# Patient Record
Sex: Male | Born: 1955 | Race: Black or African American | Hispanic: No | Marital: Married | Smoking: Never smoker
Health system: Southern US, Community
[De-identification: ages and names within clinical notes are randomized; demographics above are authoritative.]

## PROBLEM LIST (undated history)

## (undated) DIAGNOSIS — M109 Gout, unspecified: Secondary | ICD-10-CM

## (undated) DIAGNOSIS — I10 Essential (primary) hypertension: Secondary | ICD-10-CM

## (undated) DIAGNOSIS — D473 Essential (hemorrhagic) thrombocythemia: Secondary | ICD-10-CM

## (undated) DIAGNOSIS — D75839 Thrombocytosis, unspecified: Secondary | ICD-10-CM

## (undated) DIAGNOSIS — T7840XA Allergy, unspecified, initial encounter: Secondary | ICD-10-CM

## (undated) HISTORY — DX: Thrombocytosis, unspecified: D75.839

## (undated) HISTORY — DX: Gout, unspecified: M10.9

## (undated) HISTORY — DX: Essential (hemorrhagic) thrombocythemia: D47.3

## (undated) HISTORY — DX: Allergy, unspecified, initial encounter: T78.40XA

## (undated) HISTORY — DX: Essential (primary) hypertension: I10

---

## 2007-11-23 ENCOUNTER — Ambulatory Visit: Payer: Self-pay

## 2007-11-27 ENCOUNTER — Emergency Department: Payer: Self-pay | Admitting: Emergency Medicine

## 2010-06-11 ENCOUNTER — Ambulatory Visit: Payer: Self-pay | Admitting: Family Medicine

## 2010-09-17 ENCOUNTER — Emergency Department: Payer: Self-pay | Admitting: Emergency Medicine

## 2011-04-11 ENCOUNTER — Ambulatory Visit: Payer: Self-pay | Admitting: Family Medicine

## 2012-09-28 LAB — HM COLONOSCOPY

## 2014-07-18 ENCOUNTER — Ambulatory Visit: Payer: Self-pay

## 2014-07-22 ENCOUNTER — Ambulatory Visit: Payer: Self-pay

## 2014-11-14 ENCOUNTER — Ambulatory Visit: Payer: Self-pay | Admitting: Hematology and Oncology

## 2015-05-23 ENCOUNTER — Other Ambulatory Visit: Payer: Self-pay | Admitting: Family Medicine

## 2015-05-23 NOTE — Telephone Encounter (Signed)
Pt notified that he needs to schedule bp f/u. Per last ov in 01/01/15 patient was to f/u in 2 weeks. Pt told he could get 30 day refill and no further until seen.

## 2015-07-10 ENCOUNTER — Encounter: Payer: Self-pay | Admitting: Family Medicine

## 2015-07-10 ENCOUNTER — Ambulatory Visit (INDEPENDENT_AMBULATORY_CARE_PROVIDER_SITE_OTHER): Payer: 59 | Admitting: Family Medicine

## 2015-07-10 VITALS — BP 170/90 | HR 66 | Temp 98.8°F | Resp 16 | Ht 71.0 in | Wt 219.6 lb

## 2015-07-10 DIAGNOSIS — E119 Type 2 diabetes mellitus without complications: Secondary | ICD-10-CM

## 2015-07-10 DIAGNOSIS — Z23 Encounter for immunization: Secondary | ICD-10-CM | POA: Diagnosis not present

## 2015-07-10 DIAGNOSIS — I10 Essential (primary) hypertension: Secondary | ICD-10-CM | POA: Diagnosis not present

## 2015-07-10 DIAGNOSIS — R7303 Prediabetes: Secondary | ICD-10-CM | POA: Insufficient documentation

## 2015-07-10 LAB — POCT GLYCOSYLATED HEMOGLOBIN (HGB A1C): Hemoglobin A1C: 6.2

## 2015-07-10 MED ORDER — LISINOPRIL 40 MG PO TABS: 40.0000 mg | ORAL_TABLET | Freq: Every day | ORAL | Status: DC

## 2015-07-10 MED ORDER — AMLODIPINE BESYLATE 10 MG PO TABS: ORAL_TABLET | ORAL | Status: DC

## 2015-07-10 MED ORDER — METFORMIN HCL ER (OSM) 500 MG PO TB24: 500.0000 mg | ORAL_TABLET | Freq: Every day | ORAL | Status: DC

## 2015-07-10 NOTE — Progress Notes (Signed)
Name: Darrell Bartolucci Bath[place] Sr.   MRN: [id number]    DOB: 04/04/1956   Date:07/10/2015       Progress Note  Subjective  Chief Complaint  Chief Complaint  Patient presents with  . Hypertension    follow up  . Diabetes    HPI Here for f/u of HBP.  Also with DM.  Has been out of Metformin for 1 week.  Just runni ng out of BP meds.  No c/o  No problem-specific assessment & plan notes found for this encounter.   Past Medical History  Diagnosis Date  . Allergy   . Hypertension   . Gout   . Thrombocytosis ([place])     Social History  Substance Use Topics  . Smoking status: Never Smoker   . Smokeless tobacco: Not on file  . Alcohol Use: Yes     Current outpatient prescriptions:  .  amLODipine (NORVASC) 5 MG tablet, TAKE ONE TABLET BY MOUTH ONCE DAILY, Disp: 30 tablet, Rfl: 0 .  lisinopril (PRINIVIL,ZESTRIL) 40 MG tablet, Take 40 mg by mouth daily., Disp: , Rfl:  .  metformin (FORTAMET) 500 MG (OSM) 24 hr tablet, Take 500 mg by mouth daily with breakfast., Disp: , Rfl:  .  CIALIS 20 MG tablet, , Disp: , Rfl:   No Known Allergies  Review of Systems  Constitutional: Negative for fever, chills, weight loss and malaise/fatigue.  HENT: Positive for congestion. Negative for hearing loss.   Eyes: Negative for blurred vision and double vision.  Respiratory: Negative for cough, sputum production, shortness of breath and wheezing.   Cardiovascular: Negative for chest pain, palpitations and leg swelling.  Gastrointestinal: Negative for heartburn, abdominal pain and blood in stool.  Genitourinary: Negative for dysuria, urgency and frequency.  Musculoskeletal: Negative for myalgias and joint pain.  Skin: Negative for rash.  Neurological: Negative for dizziness, tremors, weakness and headaches.  Psychiatric/Behavioral: Negative for depression.      Objective  Filed Vitals:   07/10/15 0837  BP: 159/97  Pulse: 66  Temp: 98.8 F (37.1 C)  TempSrc: Oral  Resp: 16  Height: 5\' 11"   (1.803 m)  Weight: 219 lb 9.6 oz (99.61 kg)     Physical Exam  Constitutional: He is oriented to person, place, and time and well-developed, well-nourished, and in no distress.  HENT:  Head: Normocephalic and atraumatic.  Eyes: Conjunctivae and EOM are normal. Pupils are equal, round, and reactive to light. No scleral icterus.  Neck: Normal range of motion. Neck supple. Carotid bruit is not present. No thyromegaly present.  Cardiovascular: Normal rate, regular rhythm, normal heart sounds and intact distal pulses.  Exam reveals no gallop and no friction rub.   No murmur heard. Pulmonary/Chest: Effort normal and breath sounds normal. No respiratory distress. He has no wheezes.  Abdominal: Soft. Bowel sounds are normal. He exhibits no distension and no mass. There is no tenderness.  Musculoskeletal: Normal range of motion. He exhibits no edema.  Lymphadenopathy:    He has no cervical adenopathy.  Neurological: He is alert and oriented to person, place, and time.  Vitals reviewed.     No results found for this or any previous visit (from the past 2160 hour(s)).   Assessment & Plan   1. Essential hypertension  - amLODipine (NORVASC) 10 MG tablet; Take 1 tablet daily for BP  Dispense: 90 tablet; Refill: 3 - lisinopril (PRINIVIL,ZESTRIL) 40 MG tablet; Take 1 tablet (40 mg total) by mouth daily.  Dispense: 90 tablet; Refill: 3 -  Comprehensive Metabolic Panel (CMET)  2. Type 2 diabetes mellitus without complication, without long-term current use of insulin (HCC)  - POCT HgB A1C- 6.2 - metformin (FORTAMET) 500 MG (OSM) 24 hr tablet; Take 1 tablet (500 mg total) by mouth daily with breakfast.  Dispense: 90 tablet; Refill: 3 - CBC with Differential - Lipid Profile  3. Need for influenza vaccination  - Flu Vaccine QUAD 36+ mos PF IM (Fluarix & Fluzone Quad PF)

## 2015-07-11 ENCOUNTER — Telehealth: Payer: Self-pay | Admitting: Family Medicine

## 2015-07-11 NOTE — Telephone Encounter (Signed)
Called [facility] and patient medications were not that high. Lisinopril is #33.64 for 90 days and Amlodipine is 49.72 for 90 day. Patient informed.[place]

## 2015-07-11 NOTE — Telephone Encounter (Signed)
Pt said [facility] was faxing something about the BP medicine that was sent over yesterday.  It was going to cost him over $200.  He doesn't have any BP medication now.  Please call [phone]

## 2015-07-31 LAB — CBC WITH DIFFERENTIAL/PLATELET
BASOS ABS: 0 10*3/uL (ref 0.0–0.2)
Basos: 1 %
EOS (ABSOLUTE): 0.1 10*3/uL (ref 0.0–0.4)
EOS: 3 %
HEMOGLOBIN: 14.6 g/dL (ref 12.6–17.7)
Hematocrit: 43.5 % (ref 37.5–51.0)
IMMATURE GRANULOCYTES: 0 %
Immature Grans (Abs): 0 10*3/uL (ref 0.0–0.1)
LYMPHS ABS: 1.9 10*3/uL (ref 0.7–3.1)
LYMPHS: 57 %
MCH: 28.7 pg (ref 26.6–33.0)
MCHC: 33.6 g/dL (ref 31.5–35.7)
MCV: 86 fL (ref 79–97)
MONOCYTES: 8 %
Monocytes Absolute: 0.3 10*3/uL (ref 0.1–0.9)
NEUTROS PCT: 31 %
Neutrophils Absolute: 1 10*3/uL — ABNORMAL LOW (ref 1.4–7.0)
PLATELETS: 420 10*3/uL — AB (ref 150–379)
RBC: 5.08 x10E6/uL (ref 4.14–5.80)
RDW: 12.9 % (ref 12.3–15.4)
WBC: 3.3 10*3/uL — AB (ref 3.4–10.8)

## 2015-07-31 LAB — COMPREHENSIVE METABOLIC PANEL
A/G RATIO: 1.6 (ref 1.1–2.5)
ALBUMIN: 4.4 g/dL (ref 3.5–5.5)
ALK PHOS: 46 IU/L (ref 39–117)
ALT: 19 IU/L (ref 0–44)
AST: 17 IU/L (ref 0–40)
BUN / CREAT RATIO: 16 (ref 9–20)
BUN: 12 mg/dL (ref 6–24)
Bilirubin Total: 0.4 mg/dL (ref 0.0–1.2)
CALCIUM: 9.8 mg/dL (ref 8.7–10.2)
CO2: 22 mmol/L (ref 18–29)
CREATININE: 0.75 mg/dL — AB (ref 0.76–1.27)
Chloride: 101 mmol/L (ref 97–106)
GFR calc Af Amer: 117 mL/min/{1.73_m2} (ref >59)
GFR, EST NON AFRICAN AMERICAN: 101 mL/min/{1.73_m2} (ref >59)
GLOBULIN, TOTAL: 2.7 g/dL (ref 1.5–4.5)
Glucose: 123 mg/dL — ABNORMAL HIGH (ref 65–99)
POTASSIUM: 4.2 mmol/L (ref 3.5–5.2)
SODIUM: 143 mmol/L (ref 136–144)
Total Protein: 7.1 g/dL (ref 6.0–8.5)

## 2015-07-31 LAB — LIPID PANEL
CHOLESTEROL TOTAL: 218 mg/dL — AB (ref 100–199)
Chol/HDL Ratio: 3.5 ratio units (ref 0.0–5.0)
HDL: 63 mg/dL (ref >39)
LDL CALC: 132 mg/dL — AB (ref 0–99)
TRIGLYCERIDES: 114 mg/dL (ref 0–149)
VLDL CHOLESTEROL CAL: 23 mg/dL (ref 5–40)

## 2015-09-03 ENCOUNTER — Ambulatory Visit (INDEPENDENT_AMBULATORY_CARE_PROVIDER_SITE_OTHER): Payer: 59 | Admitting: Family Medicine

## 2015-09-03 ENCOUNTER — Encounter: Payer: Self-pay | Admitting: Family Medicine

## 2015-09-03 VITALS — BP 160/80 | HR 93 | Temp 98.0°F | Resp 16 | Ht 71.0 in | Wt 223.8 lb

## 2015-09-03 DIAGNOSIS — B353 Tinea pedis: Secondary | ICD-10-CM

## 2015-09-03 DIAGNOSIS — I10 Essential (primary) hypertension: Secondary | ICD-10-CM

## 2015-09-03 DIAGNOSIS — E119 Type 2 diabetes mellitus without complications: Secondary | ICD-10-CM

## 2015-09-03 LAB — MICROALBUMIN, URINE: Microalb, Ur: NEGATIVE

## 2015-09-03 LAB — POCT UA - MICROALBUMIN
Albumin/Creatinine Ratio, Urine, POC: NEGATIVE
Creatinine, POC: NEGATIVE mg/dL
MICROALBUMIN (UR) POC: NEGATIVE mg/L

## 2015-09-03 MED ORDER — AMLODIPINE BESYLATE 10 MG PO TABS: ORAL_TABLET | ORAL | Status: DC

## 2015-09-03 MED ORDER — TERBINAFINE HCL 1 % EX CREA: 1.0000 "application " | TOPICAL_CREAM | Freq: Two times a day (BID) | CUTANEOUS | Status: DC

## 2015-09-03 MED ORDER — HYDROCHLOROTHIAZIDE 12.5 MG PO TABS: 12.5000 mg | ORAL_TABLET | Freq: Every day | ORAL | Status: DC

## 2015-09-03 MED ORDER — LISINOPRIL 40 MG PO TABS: 40.0000 mg | ORAL_TABLET | Freq: Every day | ORAL | Status: DC

## 2015-09-03 MED ORDER — METFORMIN HCL 500 MG PO TABS: 500.0000 mg | ORAL_TABLET | Freq: Every day | ORAL | Status: DC

## 2015-09-03 NOTE — Progress Notes (Signed)
Name: Darrell Jackson.   MRN: [id number]    DOB: 02/18/1956   Date:09/03/2015       Progress Note  Subjective  Chief Complaint  Chief Complaint  Patient presents with  . Hypertension    HPI Here for f/u of BP and DM.  C/o cost of his meds.  BSs at home not checked.  He is taking meds  No problem-specific assessment & plan notes found for this encounter.   Past Medical History  Diagnosis Date  . Allergy   . Hypertension   . Gout   . Thrombocytosis ([place])   . Obesity     History reviewed. No pertinent past surgical history.  Family History  Problem Relation Age of Onset  . Diabetes Mother     Social History   Social History  . Marital Status: Married    Spouse Name: N/A  . Number of Children: N/A  . Years of Education: N/A   Occupational History  . Not on file.   Social History Main Topics  . Smoking status: Never Smoker   . Smokeless tobacco: Not on file  . Alcohol Use: Yes  . Drug Use: No  . Sexual Activity: Not on file   Other Topics Concern  . Not on file   Social History Narrative     Current outpatient prescriptions:  .  amLODipine (NORVASC) 10 MG tablet, Take 1 tablet daily for BP, Disp: 30 tablet, Rfl: 12 .  CIALIS 20 MG tablet, , Disp: , Rfl:  .  lisinopril (PRINIVIL,ZESTRIL) 40 MG tablet, Take 1 tablet (40 mg total) by mouth daily., Disp: 30 tablet, Rfl: 12 .  hydrochlorothiazide (HYDRODIURIL) 12.5 MG tablet, Take 1 tablet (12.5 mg total) by mouth daily., Disp: 30 tablet, Rfl: 12 .  metFORMIN (GLUCOPHAGE) 500 MG tablet, Take 1 tablet (500 mg total) by mouth daily with breakfast., Disp: 30 tablet, Rfl: 12  No Known Allergies   Review of Systems  Constitutional: Negative for fever, chills, weight loss and malaise/fatigue.  HENT: Negative for hearing loss.   Eyes: Negative for blurred vision and double vision.  Respiratory: Negative for cough, shortness of breath and wheezing.   Cardiovascular: Negative for chest pain, palpitations and  leg swelling.  Gastrointestinal: Negative for heartburn, abdominal pain and blood in stool.  Genitourinary: Negative for dysuria, urgency and frequency.  Musculoskeletal: Positive for joint pain (R shoulder). Negative for myalgias.  Skin: Negative for rash.  Neurological: Negative for dizziness, tremors, weakness and headaches.      Objective  Filed Vitals:   09/03/15 0804 09/03/15 0846  BP: 155/87 160/80  Pulse: 93   Temp: 98 F (36.7 C)   TempSrc: Oral   Resp: 16   Height: 5\' 11"  (1.803 m)   Weight: 223 lb 12.8 oz (101.515 kg)     Physical Exam  Constitutional: He is oriented to person, place, and time and well-developed, well-nourished, and in no distress. No distress.  HENT:  Head: Normocephalic and atraumatic.  Eyes: Conjunctivae and EOM are normal. Pupils are equal, round, and reactive to light. No scleral icterus.  Neck: Normal range of motion. Neck supple. Carotid bruit is not present. No thyromegaly present.  Cardiovascular: Normal rate, regular rhythm, normal heart sounds and intact distal pulses.  Exam reveals no gallop and no friction rub.   No murmur heard. Pulmonary/Chest: Effort normal and breath sounds normal. No respiratory distress. He has no wheezes. He has no rales.  Musculoskeletal: He exhibits no edema.  Lymphadenopathy:  He has no cervical adenopathy.  Neurological: He is alert and oriented to person, place, and time.  Vitals reviewed.      Recent Results (from the past 2160 hour(s))  POCT HgB A1C     Status: Normal   Collection Time: 07/10/15  9:26 AM  Result Value Ref Range   Hemoglobin A1C 6.2   CBC with Differential     Status: Abnormal   Collection Time: 07/30/15  8:26 AM  Result Value Ref Range   WBC 3.3 (L) 3.4 - 10.8 x10E3/uL   RBC 5.08 4.14 - 5.80 x10E6/uL   Hemoglobin 14.6 12.6 - 17.7 g/dL   Hematocrit 43.5 37.5 - 51.0 %   MCV 86 79 - 97 fL   MCH 28.7 26.6 - 33.0 pg   MCHC 33.6 31.5 - 35.7 g/dL   RDW 12.9 12.3 - 15.4 %    Platelets 420 (H) 150 - 379 x10E3/uL   Neutrophils 31 %   Lymphs 57 %   Monocytes 8 %   Eos 3 %   Basos 1 %   Neutrophils Absolute 1.0 (L) 1.4 - 7.0 x10E3/uL   Lymphocytes Absolute 1.9 0.7 - 3.1 x10E3/uL   Monocytes Absolute 0.3 0.1 - 0.9 x10E3/uL   EOS (ABSOLUTE) 0.1 0.0 - 0.4 x10E3/uL   Basophils Absolute 0.0 0.0 - 0.2 x10E3/uL   Immature Granulocytes 0 %   Immature Grans (Abs) 0.0 0.0 - 0.1 x10E3/uL   Hematology Comments: Note:     Comment: Verified by microscopic examination.  Lipid Profile     Status: Abnormal   Collection Time: 07/30/15  8:26 AM  Result Value Ref Range   Cholesterol, Total 218 (H) 100 - 199 mg/dL   Triglycerides 114 0 - 149 mg/dL   HDL 63 >39 mg/dL    Comment: According to ATP-III Guidelines, HDL-C >59 mg/dL is considered a negative risk factor for CHD.    VLDL Cholesterol Cal 23 5 - 40 mg/dL   LDL Calculated 132 (H) 0 - 99 mg/dL   Chol/HDL Ratio 3.5 0.0 - 5.0 ratio units    Comment:                                   T. Chol/HDL Ratio                                             Men  Women                               1/2 Avg.Risk  3.4    3.3                                   Avg.Risk  5.0    4.4                                2X Avg.Risk  9.6    7.1                                3X Avg.Risk 23.4   11.0  Comprehensive Metabolic Panel (CMET)     Status: Abnormal   Collection Time: 07/30/15  8:26 AM  Result Value Ref Range   Glucose 123 (H) 65 - 99 mg/dL    Comment: Specimen received in contact with cells. No visible hemolysis present. However GLUC may be decreased and K increased. Clinical correlation indicated.    BUN 12 6 - 24 mg/dL   Creatinine, Ser 0.75 (L) 0.76 - 1.27 mg/dL   GFR calc non Af Amer 101 >59 mL/min/1.73   GFR calc Af Amer 117 >59 mL/min/1.73   BUN/Creatinine Ratio 16 9 - 20   Sodium 143 136 - 144 mmol/L   Potassium 4.2 3.5 - 5.2 mmol/L   Chloride 101 97 - 106 mmol/L   CO2 22 18 - 29 mmol/L   Calcium 9.8 8.7 - 10.2 mg/dL    Total Protein 7.1 6.0 - 8.5 g/dL   Albumin 4.4 3.5 - 5.5 g/dL   Globulin, Total 2.7 1.5 - 4.5 g/dL   Albumin/Globulin Ratio 1.6 1.1 - 2.5   Bilirubin Total 0.4 0.0 - 1.2 mg/dL   Alkaline Phosphatase 46 39 - 117 IU/L   AST 17 0 - 40 IU/L   ALT 19 0 - 44 IU/L  POCT UA - Microalbumin     Status: Normal   Collection Time: 09/03/15  8:20 AM  Result Value Ref Range   Microalbumin Ur, POC negative mg/L   Creatinine, POC negative mg/dL   Albumin/Creatinine Ratio, Urine, POC negative      Assessment & Plan  Problem List Items Addressed This Visit      Cardiovascular and Mediastinum   HBP (high blood pressure)   Relevant Medications   hydrochlorothiazide (HYDRODIURIL) 12.5 MG tablet   amLODipine (NORVASC) 10 MG tablet   lisinopril (PRINIVIL,ZESTRIL) 40 MG tablet     Endocrine   Diabetes (HCC) - Primary   Relevant Medications   metFORMIN (GLUCOPHAGE) 500 MG tablet   lisinopril (PRINIVIL,ZESTRIL) 40 MG tablet   Other Relevant Orders   POCT UA - Microalbumin (Completed)   HM Diabetes Foot Exam (Completed)      Meds ordered this encounter  Medications  . metFORMIN (GLUCOPHAGE) 500 MG tablet    Sig: Take 1 tablet (500 mg total) by mouth daily with breakfast.    Dispense:  30 tablet    Refill:  12  . hydrochlorothiazide (HYDRODIURIL) 12.5 MG tablet    Sig: Take 1 tablet (12.5 mg total) by mouth daily.    Dispense:  30 tablet    Refill:  12  . amLODipine (NORVASC) 10 MG tablet    Sig: Take 1 tablet daily for BP    Dispense:  30 tablet    Refill:  12    No future refill appt needed.  Marland Kitchen lisinopril (PRINIVIL,ZESTRIL) 40 MG tablet    Sig: Take 1 tablet (40 mg total) by mouth daily.    Dispense:  30 tablet    Refill:  12    1. Type 2 diabetes mellitus without complication, unspecified long term insulin use status (HCC)  - POCT UA - Microalbumin- neg - HM Diabetes Foot Exam-done - metFORMIN (GLUCOPHAGE) 500 MG tablet; Take 1 tablet (500 mg total) by mouth daily with  breakfast.  Dispense: 30 tablet; Refill: 12  2. Essential hypertension  - hydrochlorothiazide (HYDRODIURIL) 12.5 MG tablet; Take 1 tablet (12.5 mg total) by mouth daily.  Dispense: 30 tablet; Refill: 12 - amLODipine (NORVASC) 10 MG tablet; Take 1 tablet daily for BP  Dispense: 30 tablet; Refill: 12 - lisinopril (PRINIVIL,ZESTRIL) 40 MG tablet; Take 1 tablet (40 mg total) by mouth daily.  Dispense: 30 tablet; Refill: 12  3. Tinea pedis of both feet  - terbinafine (LAMISIL AT) 1 % cream; Apply 1 application topically 2 (two) times daily.  Dispense: 30 g; Refill: 12

## 2015-09-04 NOTE — Addendum Note (Signed)
Addended by: Frederich Cha D on: 09/04/2015 11:13 AM   Modules accepted: Miquel Dunn

## 2015-09-11 ENCOUNTER — Encounter: Payer: Self-pay | Admitting: Family Medicine

## 2015-09-11 ENCOUNTER — Ambulatory Visit (INDEPENDENT_AMBULATORY_CARE_PROVIDER_SITE_OTHER): Admitting: Family Medicine

## 2015-09-11 VITALS — BP 150/80 | HR 69 | Temp 98.8°F | Resp 16 | Ht 71.0 in | Wt 221.0 lb

## 2015-09-11 DIAGNOSIS — Z0000 Encounter for general adult medical examination without abnormal findings: Secondary | ICD-10-CM | POA: Diagnosis not present

## 2015-09-11 DIAGNOSIS — I10 Essential (primary) hypertension: Secondary | ICD-10-CM

## 2015-09-11 DIAGNOSIS — Z23 Encounter for immunization: Secondary | ICD-10-CM

## 2015-09-11 DIAGNOSIS — E119 Type 2 diabetes mellitus without complications: Secondary | ICD-10-CM | POA: Diagnosis not present

## 2015-09-11 MED ORDER — HYDROCHLOROTHIAZIDE 25 MG PO TABS: 25.0000 mg | ORAL_TABLET | Freq: Every day | ORAL | Status: DC

## 2015-09-11 NOTE — Progress Notes (Signed)
Name: Darrell Dorst Lindner Sr.   MRN: [id number]    DOB: 1955/10/16   Date:09/11/2015       Progress Note  Subjective  Chief Complaint  Chief Complaint  Patient presents with  . Annual Exam    HPI Here for annual physical exam.  He has DM and HBP.  Overall feels well without c/o.  Taking his meds as directed.  Does not check  BSs at home.  No problem-specific assessment & plan notes found for this encounter.   Past Medical History  Diagnosis Date  . Allergy   . Hypertension   . Gout   . Thrombocytosis ([place])   . Obesity     History reviewed. No pertinent past surgical history.  Family History  Problem Relation Age of Onset  . Diabetes Mother     Social History   Social History  . Marital Status: Married    Spouse Name: N/A  . Number of Children: N/A  . Years of Education: N/A   Occupational History  . Not on file.   Social History Main Topics  . Smoking status: Never Smoker   . Smokeless tobacco: Never Used  . Alcohol Use: Yes  . Drug Use: No  . Sexual Activity: Not on file   Other Topics Concern  . Not on file   Social History Narrative     Current outpatient prescriptions:  .  amLODipine (NORVASC) 10 MG tablet, Take 1 tablet daily for BP, Disp: 30 tablet, Rfl: 12 .  CIALIS 20 MG tablet, , Disp: , Rfl:  .  hydrochlorothiazide (HYDRODIURIL) 25 MG tablet, Take 1 tablet (25 mg total) by mouth daily., Disp: 30 tablet, Rfl: 12 .  lisinopril (PRINIVIL,ZESTRIL) 40 MG tablet, Take 1 tablet (40 mg total) by mouth daily., Disp: 30 tablet, Rfl: 12 .  metFORMIN (GLUCOPHAGE) 500 MG tablet, Take 1 tablet (500 mg total) by mouth daily with breakfast., Disp: 30 tablet, Rfl: 12 .  terbinafine (LAMISIL AT) 1 % cream, Apply 1 application topically 2 (two) times daily., Disp: 30 g, Rfl: 12  No Known Allergies   Review of Systems  Constitutional: Negative for fever, chills, weight loss and malaise/fatigue.  HENT: Negative for hearing loss.   Eyes: Negative for blurred  vision and double vision.  Respiratory: Negative for cough, shortness of breath and wheezing.   Cardiovascular: Negative for chest pain, palpitations and leg swelling.  Gastrointestinal: Negative for heartburn, abdominal pain and blood in stool.  Genitourinary: Negative for dysuria, urgency and frequency.  Musculoskeletal: Positive for joint pain (R shoulder). Negative for myalgias.  Skin: Negative for rash.  Neurological: Negative for tremors, weakness and headaches.  Psychiatric/Behavioral: Negative for depression. The patient is not nervous/anxious and does not have insomnia.       Objective  Filed Vitals:   09/11/15 0845 09/11/15 0944  BP: 169/94 150/80  Pulse: 69   Temp: 98.8 F (37.1 C)   TempSrc: Oral   Resp: 16   Height: 5\' 11"  (1.803 m)   Weight: 221 lb (100.245 kg)     Physical Exam  Constitutional: He appears distressed.  HENT:  Head: Normocephalic and atraumatic.  Right Ear: External ear normal.  Left Ear: External ear normal.  Mouth/Throat: Oropharynx is clear and moist.  Eyes: Conjunctivae and EOM are normal. Pupils are equal, round, and reactive to light. Right eye exhibits no discharge. Left eye exhibits no discharge. No scleral icterus.  Neck: Normal range of motion. Neck supple. Carotid bruit is not present.  No thyromegaly present.  Cardiovascular: Normal rate, regular rhythm, normal heart sounds and intact distal pulses.  Exam reveals no gallop and no friction rub.   No murmur heard. Pulmonary/Chest: Effort normal and breath sounds normal. No respiratory distress. He has no wheezes. He has no rales.  Abdominal: Soft. Bowel sounds are normal. He exhibits no distension, no abdominal bruit and no mass. There is no tenderness.  Genitourinary: Penis normal. No discharge found.  Musculoskeletal: He exhibits no edema or tenderness.  Lymphadenopathy:    He has no cervical adenopathy.  Neurological: He is alert. No cranial nerve deficit. Coordination normal.   Skin:  Tinea pedis on both feet  Psychiatric: Mood, memory, affect and judgment normal.  Vitals reviewed.      Recent Results (from the past 2160 hour(s))  POCT HgB A1C     Status: Normal   Collection Time: 07/10/15  9:26 AM  Result Value Ref Range   Hemoglobin A1C 6.2   CBC with Differential     Status: Abnormal   Collection Time: 07/30/15  8:26 AM  Result Value Ref Range   WBC 3.3 (L) 3.4 - 10.8 x10E3/uL   RBC 5.08 4.14 - 5.80 x10E6/uL   Hemoglobin 14.6 12.6 - 17.7 g/dL   Hematocrit 43.5 37.5 - 51.0 %   MCV 86 79 - 97 fL   MCH 28.7 26.6 - 33.0 pg   MCHC 33.6 31.5 - 35.7 g/dL   RDW 12.9 12.3 - 15.4 %   Platelets 420 (H) 150 - 379 x10E3/uL   Neutrophils 31 %   Lymphs 57 %   Monocytes 8 %   Eos 3 %   Basos 1 %   Neutrophils Absolute 1.0 (L) 1.4 - 7.0 x10E3/uL   Lymphocytes Absolute 1.9 0.7 - 3.1 x10E3/uL   Monocytes Absolute 0.3 0.1 - 0.9 x10E3/uL   EOS (ABSOLUTE) 0.1 0.0 - 0.4 x10E3/uL   Basophils Absolute 0.0 0.0 - 0.2 x10E3/uL   Immature Granulocytes 0 %   Immature Grans (Abs) 0.0 0.0 - 0.1 x10E3/uL   Hematology Comments: Note:     Comment: Verified by microscopic examination.  Lipid Profile     Status: Abnormal   Collection Time: 07/30/15  8:26 AM  Result Value Ref Range   Cholesterol, Total 218 (H) 100 - 199 mg/dL   Triglycerides 114 0 - 149 mg/dL   HDL 63 >39 mg/dL    Comment: According to ATP-III Guidelines, HDL-C >59 mg/dL is considered a negative risk factor for CHD.    VLDL Cholesterol Cal 23 5 - 40 mg/dL   LDL Calculated 132 (H) 0 - 99 mg/dL   Chol/HDL Ratio 3.5 0.0 - 5.0 ratio units    Comment:                                   T. Chol/HDL Ratio                                             Men  Women                               1/2 Avg.Risk  3.4    3.3  Avg.Risk  5.0    4.4                                2X Avg.Risk  9.6    7.1                                3X Avg.Risk 23.4   11.0   Comprehensive Metabolic  Panel (CMET)     Status: Abnormal   Collection Time: 07/30/15  8:26 AM  Result Value Ref Range   Glucose 123 (H) 65 - 99 mg/dL    Comment: Specimen received in contact with cells. No visible hemolysis present. However GLUC may be decreased and K increased. Clinical correlation indicated.    BUN 12 6 - 24 mg/dL   Creatinine, Ser 0.75 (L) 0.76 - 1.27 mg/dL   GFR calc non Af Amer 101 >59 mL/min/1.73   GFR calc Af Amer 117 >59 mL/min/1.73   BUN/Creatinine Ratio 16 9 - 20   Sodium 143 136 - 144 mmol/L   Potassium 4.2 3.5 - 5.2 mmol/L   Chloride 101 97 - 106 mmol/L   CO2 22 18 - 29 mmol/L   Calcium 9.8 8.7 - 10.2 mg/dL   Total Protein 7.1 6.0 - 8.5 g/dL   Albumin 4.4 3.5 - 5.5 g/dL   Globulin, Total 2.7 1.5 - 4.5 g/dL   Albumin/Globulin Ratio 1.6 1.1 - 2.5   Bilirubin Total 0.4 0.0 - 1.2 mg/dL   Alkaline Phosphatase 46 39 - 117 IU/L   AST 17 0 - 40 IU/L   ALT 19 0 - 44 IU/L  Microalbumin, urine     Status: None   Collection Time: 09/03/15 12:00 AM  Result Value Ref Range   Microalb, Ur negative   POCT UA - Microalbumin     Status: Normal   Collection Time: 09/03/15  8:20 AM  Result Value Ref Range   Microalbumin Ur, POC negative mg/L   Creatinine, POC negative mg/dL   Albumin/Creatinine Ratio, Urine, POC negative      Assessment & Plan  Problem List Items Addressed This Visit      Cardiovascular and Mediastinum   HBP (high blood pressure)   Relevant Medications   hydrochlorothiazide (HYDRODIURIL) 25 MG tablet     Endocrine   Diabetes ([place])   Relevant Orders   HM Diabetes Foot Exam (Completed)     Other   Annual physical exam - Primary    Other Visit Diagnoses    Immunization due        Relevant Orders    Pneumococcal polysaccharide vaccine 23-valent greater than or equal to 2yo subcutaneous/IM       Meds ordered this encounter  Medications  . hydrochlorothiazide (HYDRODIURIL) 25 MG tablet    Sig: Take 1 tablet (25 mg total) by mouth daily.    Dispense:  30  tablet    Refill:  12   1. Annual physical exam   2. Type 2 diabetes mellitus without complication, without long-term current use of insulin (HCC) -cont. metformin - HM Diabetes Foot Exam  3. Essential hypertension Cont amlodipine and lisinopril - hydrochlorothiazide (HYDRODIURIL) 25 MG tablet; Take 1 tablet (25 mg total) by mouth daily.  Dispense: 30 tablet; Refill: 12  4. Immunization due  - Pneumococcal polysaccharide vaccine 23-valent greater than or equal to 2yo subcutaneous/IM

## 2015-09-11 NOTE — Patient Instructions (Signed)
Continue using foot fungus preparations on both feet.

## 2015-10-24 ENCOUNTER — Ambulatory Visit (INDEPENDENT_AMBULATORY_CARE_PROVIDER_SITE_OTHER): Payer: 59 | Admitting: Family Medicine

## 2015-10-24 ENCOUNTER — Encounter: Payer: Self-pay | Admitting: Family Medicine

## 2015-10-24 VITALS — BP 130/80 | HR 75 | Temp 98.7°F | Resp 16 | Ht 71.0 in | Wt 222.6 lb

## 2015-10-24 DIAGNOSIS — Z205 Contact with and (suspected) exposure to viral hepatitis: Secondary | ICD-10-CM | POA: Insufficient documentation

## 2015-10-24 DIAGNOSIS — E119 Type 2 diabetes mellitus without complications: Secondary | ICD-10-CM | POA: Diagnosis not present

## 2015-10-24 DIAGNOSIS — I10 Essential (primary) hypertension: Secondary | ICD-10-CM

## 2015-10-24 LAB — POCT GLYCOSYLATED HEMOGLOBIN (HGB A1C): HEMOGLOBIN A1C: 6.2

## 2015-10-24 NOTE — Patient Instructions (Signed)
Discussed diet and weight loss. 

## 2015-10-24 NOTE — Progress Notes (Signed)
Name: Darrell Wallo Shetterly Sr.   MRN: [id number]    DOB: June 25, 1956   Date:10/24/2015       Progress Note  Subjective  Chief Complaint  Chief Complaint  Patient presents with  . Diabetes    last a1c 06/2015 6.2 obtw pt would like to talk about hep c    HPI Here for f/u of DM and HBP.  BSs not checked at home.  BPs at home around 130/80.  Feeling well.  Is concerned that he may have been exposed to Hep C several years ago (sexual encounter).  No problem-specific assessment & plan notes found for this encounter.   Past Medical History  Diagnosis Date  . Allergy   . Hypertension   . Gout   . Thrombocytosis ([place])   . Obesity     History reviewed. No pertinent past surgical history.  Family History  Problem Relation Age of Onset  . Diabetes Mother     Social History   Social History  . Marital Status: Married    Spouse Name: N/A  . Number of Children: N/A  . Years of Education: N/A   Occupational History  . Not on file.   Social History Main Topics  . Smoking status: Never Smoker   . Smokeless tobacco: Never Used  . Alcohol Use: Yes  . Drug Use: No  . Sexual Activity: Not on file   Other Topics Concern  . Not on file   Social History Narrative     Current outpatient prescriptions:  .  amLODipine (NORVASC) 10 MG tablet, Take 1 tablet daily for BP, Disp: 30 tablet, Rfl: 12 .  CIALIS 20 MG tablet, , Disp: , Rfl:  .  hydrochlorothiazide (HYDRODIURIL) 25 MG tablet, Take 1 tablet (25 mg total) by mouth daily., Disp: 30 tablet, Rfl: 12 .  Hydrocodone-Acetaminophen 7.5-300 MG TABS, , Disp: , Rfl:  .  lisinopril (PRINIVIL,ZESTRIL) 40 MG tablet, Take 1 tablet (40 mg total) by mouth daily., Disp: 30 tablet, Rfl: 12 .  metFORMIN (GLUCOPHAGE) 500 MG tablet, Take 1 tablet (500 mg total) by mouth daily with breakfast., Disp: 30 tablet, Rfl: 12 .  ONE TOUCH ULTRA TEST test strip, , Disp: , Rfl:  .  ONETOUCH DELICA LANCETS 99991111 MISC, , Disp: , Rfl:  .  terbinafine (LAMISIL AT)  1 % cream, Apply 1 application topically 2 (two) times daily., Disp: 30 g, Rfl: 12  Not on File   Review of Systems  Constitutional: Negative for fever, chills, weight loss and malaise/fatigue.  HENT: Negative for congestion and hearing loss.   Eyes: Negative for blurred vision and double vision.  Respiratory: Negative for cough, shortness of breath and wheezing.   Cardiovascular: Negative for chest pain, palpitations and leg swelling.  Gastrointestinal: Negative for heartburn, abdominal pain and blood in stool.  Genitourinary: Negative for dysuria, urgency and frequency.  Musculoskeletal: Negative for myalgias and joint pain.  Skin: Negative for rash.  Neurological: Negative for dizziness, tremors, weakness and headaches.      Objective  Filed Vitals:   10/24/15 0806  BP: 130/80  Pulse: 75  Temp: 98.7 F (37.1 C)  TempSrc: Oral  Resp: 16  Height: 5\' 11"  (1.803 m)  Weight: 222 lb 9.6 oz (100.971 kg)    Physical Exam  Constitutional: He is oriented to person, place, and time. No distress.  HENT:  Head: Normocephalic and atraumatic.  Eyes: Conjunctivae and EOM are normal. Pupils are equal, round, and reactive to light. No scleral  icterus.  Neck: Normal range of motion. Neck supple. Carotid bruit is not present. No thyromegaly present.  Cardiovascular: Normal rate, regular rhythm and normal heart sounds.  Exam reveals no gallop and no friction rub.   No murmur heard. Pulmonary/Chest: Effort normal and breath sounds normal. No respiratory distress. He has no wheezes. He has no rales.  Abdominal: Soft. Bowel sounds are normal. He exhibits no distension, no abdominal bruit and no mass. There is no tenderness.  Musculoskeletal: He exhibits no edema.  Lymphadenopathy:    He has no cervical adenopathy.  Neurological: He is alert and oriented to person, place, and time.  Vitals reviewed.      Recent Results (from the past 2160 hour(s))  CBC with Differential     Status:  Abnormal   Collection Time: 07/30/15  8:26 AM  Result Value Ref Range   WBC 3.3 (L) 3.4 - 10.8 x10E3/uL   RBC 5.08 4.14 - 5.80 x10E6/uL   Hemoglobin 14.6 12.6 - 17.7 g/dL   Hematocrit 43.5 37.5 - 51.0 %   MCV 86 79 - 97 fL   MCH 28.7 26.6 - 33.0 pg   MCHC 33.6 31.5 - 35.7 g/dL   RDW 12.9 12.3 - 15.4 %   Platelets 420 (H) 150 - 379 x10E3/uL   Neutrophils 31 %   Lymphs 57 %   Monocytes 8 %   Eos 3 %   Basos 1 %   Neutrophils Absolute 1.0 (L) 1.4 - 7.0 x10E3/uL   Lymphocytes Absolute 1.9 0.7 - 3.1 x10E3/uL   Monocytes Absolute 0.3 0.1 - 0.9 x10E3/uL   EOS (ABSOLUTE) 0.1 0.0 - 0.4 x10E3/uL   Basophils Absolute 0.0 0.0 - 0.2 x10E3/uL   Immature Granulocytes 0 %   Immature Grans (Abs) 0.0 0.0 - 0.1 x10E3/uL   Hematology Comments: Note:     Comment: Verified by microscopic examination.  Lipid Profile     Status: Abnormal   Collection Time: 07/30/15  8:26 AM  Result Value Ref Range   Cholesterol, Total 218 (H) 100 - 199 mg/dL   Triglycerides 114 0 - 149 mg/dL   HDL 63 >39 mg/dL    Comment: According to ATP-III Guidelines, HDL-C >59 mg/dL is considered a negative risk factor for CHD.    VLDL Cholesterol Cal 23 5 - 40 mg/dL   LDL Calculated 132 (H) 0 - 99 mg/dL   Chol/HDL Ratio 3.5 0.0 - 5.0 ratio units    Comment:                                   T. Chol/HDL Ratio                                             Men  Women                               1/2 Avg.Risk  3.4    3.3                                   Avg.Risk  5.0    4.4  2X Avg.Risk  9.6    7.1                                3X Avg.Risk 23.4   11.0   Comprehensive Metabolic Panel (CMET)     Status: Abnormal   Collection Time: 07/30/15  8:26 AM  Result Value Ref Range   Glucose 123 (H) 65 - 99 mg/dL    Comment: Specimen received in contact with cells. No visible hemolysis present. However GLUC may be decreased and K increased. Clinical correlation indicated.    BUN 12 6 - 24 mg/dL    Creatinine, Ser 0.75 (L) 0.76 - 1.27 mg/dL   GFR calc non Af Amer 101 >59 mL/min/1.73   GFR calc Af Amer 117 >59 mL/min/1.73   BUN/Creatinine Ratio 16 9 - 20   Sodium 143 136 - 144 mmol/L   Potassium 4.2 3.5 - 5.2 mmol/L   Chloride 101 97 - 106 mmol/L   CO2 22 18 - 29 mmol/L   Calcium 9.8 8.7 - 10.2 mg/dL   Total Protein 7.1 6.0 - 8.5 g/dL   Albumin 4.4 3.5 - 5.5 g/dL   Globulin, Total 2.7 1.5 - 4.5 g/dL   Albumin/Globulin Ratio 1.6 1.1 - 2.5   Bilirubin Total 0.4 0.0 - 1.2 mg/dL   Alkaline Phosphatase 46 39 - 117 IU/L   AST 17 0 - 40 IU/L   ALT 19 0 - 44 IU/L  Microalbumin, urine     Status: None   Collection Time: 09/03/15 12:00 AM  Result Value Ref Range   Microalb, Ur negative   POCT UA - Microalbumin     Status: Normal   Collection Time: 09/03/15  8:20 AM  Result Value Ref Range   Microalbumin Ur, POC negative mg/L   Creatinine, POC negative mg/dL   Albumin/Creatinine Ratio, Urine, POC negative   POCT HgB A1C     Status: Normal   Collection Time: 10/24/15  8:18 AM  Result Value Ref Range   Hemoglobin A1C 6.2      Assessment & Plan  Problem List Items Addressed This Visit      Cardiovascular and Mediastinum   HBP (high blood pressure)     Endocrine   Diabetes (HCC) - Primary   Relevant Orders   POCT HgB A1C (Completed)     Other   Exposure to hepatitis C   Relevant Orders   Hepatitis C Antibody      Meds ordered this encounter  Medications  . Hydrocodone-Acetaminophen 7.5-300 MG TABS    Sig:   . ONETOUCH DELICA LANCETS 99991111 MISC    Sig:   . ONE TOUCH ULTRA TEST test strip    Sig:     1. Type 2 diabetes mellitus without complication, without long-term current use of insulin (HCC) Cont. med - POCT HgB A1C-6.2 2. Essential hypertension Cont meds  3. Exposure to Hepatitis C Hepatitis C antoibody

## 2015-10-25 LAB — HEPATITIS C ANTIBODY: HEP C VIRUS AB: 0.1 {s_co_ratio} (ref 0.0–0.9)

## 2015-10-28 ENCOUNTER — Telehealth: Payer: Self-pay | Admitting: Family Medicine

## 2015-10-28 NOTE — Telephone Encounter (Signed)
Pt called for lab results.  Please call [phone]

## 2015-10-28 NOTE — Telephone Encounter (Signed)
Advised 

## 2015-11-12 ENCOUNTER — Ambulatory Visit: Admitting: Family Medicine

## 2015-12-05 ENCOUNTER — Ambulatory Visit: Admitting: Family Medicine

## 2015-12-17 ENCOUNTER — Telehealth: Payer: Self-pay | Admitting: Family Medicine

## 2015-12-17 ENCOUNTER — Other Ambulatory Visit: Payer: Self-pay | Admitting: Family Medicine

## 2015-12-17 DIAGNOSIS — M109 Gout, unspecified: Secondary | ICD-10-CM

## 2015-12-17 MED ORDER — COLCHICINE 0.6 MG PO TABS: 0.6000 mg | ORAL_TABLET | Freq: Every day | ORAL | Status: DC

## 2015-12-17 NOTE — Telephone Encounter (Signed)
Pt is having a flare up of gout.  He doesn't remember if he has been seen here before with this or not but asked for a refill on gout medication.  Please call [phone]

## 2015-12-17 NOTE — Telephone Encounter (Signed)
Have oraled Indocin  25 mg.tid to [facility].  They will contact patient.  I, probably need to see opatient to discuss his gout.-jhj

## 2015-12-17 NOTE — Telephone Encounter (Signed)
May send to his Pharmacy, Colcrys, 0.6 mg., take 2 at start of gout flair, then 1 tab 1 hour later, then 1 tab daily until flair resolved.  #30/6 refills.

## 2015-12-17 NOTE — Telephone Encounter (Signed)
Left detailed message saying his Rx was send to his pharmacy.

## 2015-12-17 NOTE — Telephone Encounter (Signed)
The medication that was sent to pharmacy for gout was over $100 dollars.  He asked if there was something less expensive he could try.  Please call [phone]

## 2015-12-17 NOTE — Telephone Encounter (Signed)
Pt has not been seen here for gout do you want this to be refill or he needs to be seen please suggest ?

## 2015-12-18 ENCOUNTER — Other Ambulatory Visit: Payer: Self-pay | Admitting: Family Medicine

## 2015-12-18 DIAGNOSIS — M109 Gout, unspecified: Secondary | ICD-10-CM

## 2015-12-18 MED ORDER — INDOMETHACIN 25 MG PO CAPS: 25.0000 mg | ORAL_CAPSULE | Freq: Three times a day (TID) | ORAL | Status: DC

## 2015-12-18 NOTE — Telephone Encounter (Signed)
Called pharmacy and also send electronically the Rx and as per pharmacist Dr. Luan Pulling really wanted him to be on colchicine since indocin has side effect and one of pharamcist callled insurance company and brand name was reduced to $ 15 copay pt is aware of all these and he picked up Rx last night and also instructed pharmacist if he needs more refill he needs to be seen and also left detailed message for pt and they will delete Rx for indocin.

## 2015-12-19 ENCOUNTER — Other Ambulatory Visit: Payer: Self-pay | Admitting: *Deleted

## 2015-12-26 ENCOUNTER — Other Ambulatory Visit: Payer: Self-pay | Admitting: Family Medicine

## 2015-12-26 DIAGNOSIS — M109 Gout, unspecified: Secondary | ICD-10-CM

## 2015-12-26 DIAGNOSIS — I10 Essential (primary) hypertension: Secondary | ICD-10-CM

## 2015-12-26 DIAGNOSIS — E119 Type 2 diabetes mellitus without complications: Secondary | ICD-10-CM

## 2015-12-26 MED ORDER — METFORMIN HCL 500 MG PO TABS: 500.0000 mg | ORAL_TABLET | Freq: Every day | ORAL | Status: DC

## 2015-12-26 MED ORDER — AMLODIPINE BESYLATE 10 MG PO TABS: ORAL_TABLET | ORAL | Status: DC

## 2015-12-26 MED ORDER — COLCHICINE 0.6 MG PO TABS: 0.6000 mg | ORAL_TABLET | Freq: Every day | ORAL | Status: DC

## 2015-12-27 ENCOUNTER — Other Ambulatory Visit: Payer: Self-pay | Admitting: *Deleted

## 2015-12-27 DIAGNOSIS — M109 Gout, unspecified: Secondary | ICD-10-CM

## 2015-12-27 MED ORDER — COLCHICINE 0.6 MG PO TABS: 0.6000 mg | ORAL_TABLET | Freq: Every day | ORAL | Status: DC

## 2015-12-31 ENCOUNTER — Other Ambulatory Visit: Payer: Self-pay | Admitting: *Deleted

## 2015-12-31 DIAGNOSIS — I10 Essential (primary) hypertension: Secondary | ICD-10-CM

## 2015-12-31 DIAGNOSIS — E119 Type 2 diabetes mellitus without complications: Secondary | ICD-10-CM

## 2015-12-31 DIAGNOSIS — M109 Gout, unspecified: Secondary | ICD-10-CM

## 2015-12-31 MED ORDER — COLCHICINE 0.6 MG PO TABS: 0.6000 mg | ORAL_TABLET | Freq: Every day | ORAL | Status: DC

## 2015-12-31 MED ORDER — METFORMIN HCL 500 MG PO TABS: 500.0000 mg | ORAL_TABLET | Freq: Every day | ORAL | Status: DC

## 2015-12-31 MED ORDER — AMLODIPINE BESYLATE 10 MG PO TABS: ORAL_TABLET | ORAL | Status: DC

## 2016-01-14 ENCOUNTER — Other Ambulatory Visit: Payer: Self-pay | Admitting: Family Medicine

## 2016-01-14 DIAGNOSIS — I10 Essential (primary) hypertension: Secondary | ICD-10-CM

## 2016-01-14 DIAGNOSIS — M109 Gout, unspecified: Secondary | ICD-10-CM

## 2016-01-14 DIAGNOSIS — E119 Type 2 diabetes mellitus without complications: Secondary | ICD-10-CM

## 2016-01-14 MED ORDER — LISINOPRIL 40 MG PO TABS
40.0000 mg | ORAL_TABLET | Freq: Every day | ORAL | Status: DC
Start: 2016-01-14 — End: 2016-09-07

## 2016-01-14 MED ORDER — COLCHICINE 0.6 MG PO TABS: 0.6000 mg | ORAL_TABLET | Freq: Every day | ORAL | Status: DC

## 2016-01-14 MED ORDER — AMLODIPINE BESYLATE 10 MG PO TABS: ORAL_TABLET | ORAL | Status: DC

## 2016-01-14 MED ORDER — METFORMIN HCL 500 MG PO TABS: 500.0000 mg | ORAL_TABLET | Freq: Every day | ORAL | Status: DC

## 2016-02-27 ENCOUNTER — Ambulatory Visit: Admitting: Family Medicine

## 2016-03-03 ENCOUNTER — Ambulatory Visit
Admission: RE | Admit: 2016-03-03 | Discharge: 2016-03-03 | Disposition: A | Discharge status: Home / Self Care | Payer: 59 | Source: Ambulatory Visit | Attending: Family Medicine | Admitting: Family Medicine

## 2016-03-03 ENCOUNTER — Ambulatory Visit: Admitting: Family Medicine

## 2016-03-03 ENCOUNTER — Ambulatory Visit (INDEPENDENT_AMBULATORY_CARE_PROVIDER_SITE_OTHER): Payer: 59 | Admitting: Family Medicine

## 2016-03-03 ENCOUNTER — Other Ambulatory Visit: Payer: Self-pay | Admitting: *Deleted

## 2016-03-03 ENCOUNTER — Encounter: Payer: Self-pay | Admitting: Family Medicine

## 2016-03-03 ENCOUNTER — Telehealth: Payer: Self-pay | Admitting: Family Medicine

## 2016-03-03 VITALS — BP 149/90 | HR 85 | Temp 98.8°F | Resp 18 | Ht 71.0 in | Wt 223.0 lb

## 2016-03-03 DIAGNOSIS — M25562 Pain in left knee: Secondary | ICD-10-CM

## 2016-03-03 DIAGNOSIS — M654 Radial styloid tenosynovitis [de Quervain]: Secondary | ICD-10-CM | POA: Diagnosis not present

## 2016-03-03 MED ORDER — NAPROXEN 500 MG PO TABS: 500.0000 mg | ORAL_TABLET | Freq: Two times a day (BID) | ORAL | Status: DC

## 2016-03-03 MED ORDER — THUMB SPLINT/RIGHT LARGE MISC: 1.0000 | Freq: Every day | Status: DC

## 2016-03-03 NOTE — Progress Notes (Signed)
thum  Subjective:    Patient ID: Darrell RABELO Sr., male    DOB: August 28, 1956, 60 y.o.   MRN: [id number]  HPI: Darrell JARACZ Sr. is a 60 y.o. male presenting on 03/03/2016 for Knee Pain and Hand Pain   HPI  Pt presents for knee and hand pain. Knee swelling present 1.5 mos. Knee swelling started overnight. Swelling is stable. 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 Knee is painful stepping up. Stepping down is uncomfortable- pain is 8/10. Not painful when not moving. Not tender to touch. No locking, clicking Darrell popping. Hurts to turn leg in bed at night. No instability.  Home treatment: Ice pack- trying ice at home. Knee brace in care. Taking advil 400mg  this AM. Did help.  Hand pain and swelling- noticed this morning. R hand is swollen around the thumb. No trauma. Woke up the AM. Painful through snuffbox to move the hand.   Past Medical History  Diagnosis Date  . Allergy   . Hypertension   . Gout   . Thrombocytosis ([place])   . Obesity     Current Outpatient Prescriptions on File Prior to Visit  Medication Sig  . amLODipine (NORVASC) 10 MG tablet Take 1 tablet daily for BP  . CIALIS 20 MG tablet   . colchicine 0.6 MG tablet Take 1 tablet (0.6 mg total) by mouth daily.  . hydrochlorothiazide (HYDRODIURIL) 25 MG tablet Take 1 tablet (25 mg total) by mouth daily.  . Hydrocodone-Acetaminophen 7.5-300 MG TABS   . lisinopril (PRINIVIL,ZESTRIL) 40 MG tablet Take 1 tablet (40 mg total) by mouth daily.  . metFORMIN (GLUCOPHAGE) 500 MG tablet Take 1 tablet (500 mg total) by mouth daily with breakfast.  . ONE TOUCH ULTRA TEST test strip   . ONETOUCH DELICA LANCETS 99991111 MISC   . terbinafine (LAMISIL AT) 1 % cream Apply 1 application topically 2 (two) times daily.   No current facility-administered medications on file prior to visit.    Review of Systems  Constitutional: Negative for fever and chills.  HENT: Negative.   Respiratory: Negative for chest  tightness, shortness of breath and wheezing.   Cardiovascular: Negative for chest pain, palpitations and leg swelling.  Gastrointestinal: Negative for nausea, vomiting and abdominal pain.  Endocrine: Negative.   Genitourinary: Negative for dysuria, urgency, discharge, penile pain and testicular pain.  Musculoskeletal: Positive for myalgias, joint swelling and arthralgias. Negative for back pain.  Skin: Negative.   Neurological: Negative for dizziness, weakness, numbness and headaches.  Psychiatric/Behavioral: Negative for sleep disturbance and dysphoric mood.   Per HPI unless specifically indicated above     Objective:    BP 149/90 mmHg  Pulse 85  Temp(Src) 98.8 F (37.1 C) (Oral)  Resp 18  Ht 5\' 11"  (1.803 m)  Wt 223 lb (101.152 kg)  BMI 31.12 kg/m2  Wt Readings from Last 3 Encounters:  03/03/16 223 lb (101.152 kg)  10/24/15 222 lb 9.6 oz (100.971 kg)  09/11/15 221 lb (100.245 kg)    Physical Exam  Constitutional: He is oriented to person, place, and time. He appears well-developed and well-nourished. No distress.  HENT:  Head: Normocephalic and atraumatic.  Neck: Neck supple. No thyromegaly present.  Cardiovascular: Normal rate, regular rhythm and normal heart sounds.  Exam reveals no gallop and no friction rub.   No murmur heard. Pulmonary/Chest: Effort normal and breath sounds normal. He has no wheezes.  Abdominal: Soft. Bowel sounds are normal. He exhibits no  distension. There is no tenderness. There is no rebound.  Musculoskeletal: Normal range of motion. He exhibits no edema.       Right wrist: He exhibits tenderness and swelling. He exhibits normal range of motion, no deformity and no laceration.       Left knee: He exhibits swelling. He exhibits normal range of motion, no ecchymosis, no laceration, normal alignment, no LCL laxity, no bony tenderness, normal meniscus and no MCL laxity. No tenderness found.       Right hand: He exhibits tenderness and swelling. Normal  sensation noted. Normal strength noted.  + Wynn Maudlin and Eichoff's test.  Swelling with possible joint effusion on the Anterior and medial surface of the L knee.  Negative varus/valgus  Neurological: He is alert and oriented to person, place, and time. He has normal reflexes.  Skin: Skin is warm and dry. No rash noted. No erythema.  Psychiatric: He has a normal mood and affect. His behavior is normal. Thought content normal.   Results for orders placed Darrell performed in visit on 10/24/15  Hepatitis C Antibody  Result Value Ref Range   Hep C Virus Ab 0.1 0.0 - 0.9 s/co ratio  POCT HgB A1C  Result Value Ref Range   Hemoglobin A1C 6.2       Assessment & Plan:   Problem List Items Addressed This Visit    None    Visit Diagnoses    De Quervain's tenosynovitis, right    -  Primary    Likely tendonitis. Naproxen BID and recommend OTC thumb immobilizer. Ice the wrist and hand for comfort.  Refer to ortho for continued pain.     Relevant Medications    Elastic Bandages & Supports (THUMB SPLINT/RIGHT LARGE) MISC    Left anterior knee pain        Probably joint effusion due to swelling. Naproxen BID. Ice for comfort. Recheck 2-3 weeks. Consider ortho without improvement.     Relevant Orders    DG Knee Complete 4 Views Left       Meds ordered this encounter  Medications  . DISCONTD: naproxen (NAPROSYN) 500 MG tablet    Sig: Take 1 tablet (500 mg total) by mouth 2 (two) times daily with a meal.    Dispense:  30 tablet    Refill:  2    Order Specific Question:  Supervising Provider    Answer:  Arlis Porta [phone]  . Elastic Bandages & Supports (THUMB SPLINT/RIGHT LARGE) MISC    Sig: 1 each by Does not apply route daily.    Dispense:  1 each    Refill:  0    Order Specific Question:  Supervising Provider    Answer:  Arlis Porta [id number]      Follow up plan: Return in about 3 weeks (around 03/24/2016) for Knee and hand pain. Marland Kitchen

## 2016-03-03 NOTE — Patient Instructions (Signed)
De Quervain Tenosynovitis °Tendons attach muscles to bones. They also help with joint movements. When tendons become irritated or swollen, it is called tendinitis. °The extensor pollicis brevis (EPB) tendon connects the EPB muscle to a bone that is near the base of the thumb. The EPB muscle helps to straighten and extend the thumb. De Quervain tenosynovitis is a condition in which the EPB tendon lining (sheath) becomes irritated, thickened, and swollen. This condition is sometimes called stenosing tenosynovitis. This condition causes pain on the thumb side of the back of the wrist. °CAUSES °Causes of this condition include: °· Activities that repeatedly cause your thumb and wrist to extend. °· A sudden increase in activity or change in activity that affects your wrist. °RISK FACTORS: °This condition is more likely to develop in: °· Females. °· People who have diabetes. °· Women who have recently given birth. °· People who are over 40 years of age. °· People who do activities that involve repeated hand and wrist motions, such as tennis, racquetball, volleyball, gardening, and taking care of children. °· People who do heavy labor. °· People who have poor wrist strength and flexibility. °· People who do not warm up properly before activities. °SYMPTOMS °Symptoms of this condition include: °· Pain or tenderness over the thumb side of the back of the wrist when your thumb and wrist are not moving. °· Pain that gets worse when you straighten your thumb or extend your thumb or wrist. °· Pain when the injured area is touched. °· Locking or catching of the thumb joint while you bend and straighten your thumb. °· Decreased thumb motion due to pain. °· Swelling over the affected area. °DIAGNOSIS °This condition is diagnosed with a medical history and physical exam. Your health care provider will ask for details about your injury and ask about your symptoms. °TREATMENT °Treatment may include the use of icing and medicines to  reduce pain and swelling. You may also be advised to wear a splint or brace to limit your thumb and wrist motion. In less severe cases, treatment may also include working with a physical therapist to strengthen your wrist and calm the irritation around your EPB tendon sheath. In severe cases, surgery may be needed. °HOME CARE INSTRUCTIONS °If You Have a Splint or Brace: °· Wear it as told by your health care provider. Remove it only as told by your health care provider. °· Loosen the splint or brace if your fingers become numb and tingle, or if they turn cold and blue. °· Keep the splint or brace clean and dry. °Managing Pain, Stiffness, and Swelling  °· If directed, apply ice to the injured area. °¨ Put ice in a plastic bag. °¨ Place a towel between your skin and the bag. °¨ Leave the ice on for 20 minutes, 2-3 times per day. °· Move your fingers often to avoid stiffness and to lessen swelling. °· Raise (elevate) the injured area above the level of your heart while you are sitting or lying down. °General Instructions °· Return to your normal activities as told by your health care provider. Ask your health care provider what activities are safe for you. °· Take over-the-counter and prescription medicines only as told by your health care provider. °· Keep all follow-up visits as told by your health care provider. This is important. °· Do not drive or operate heavy machinery while taking prescription pain medicine. °SEEK MEDICAL CARE IF: °· Your pain, tenderness, or swelling gets worse, even if you have had   treatment. °· You have numbness or tingling in your wrist, hand, or fingers on the injured side. °  °This information is not intended to replace advice given to you by your health care provider. Make sure you discuss any questions you have with your health care provider. °  °Document Released: 09/07/2005 Document Revised: 05/29/2015 Document Reviewed: 11/13/2014 °Elsevier Interactive Patient Education ©2016  [employer] ° °

## 2016-03-03 NOTE — Telephone Encounter (Signed)
Called to review XR results. Pt verbalized understanding.

## 2016-03-04 ENCOUNTER — Ambulatory Visit: Admitting: Family Medicine

## 2016-03-21 ENCOUNTER — Other Ambulatory Visit: Payer: Self-pay | Admitting: Family Medicine

## 2016-03-26 ENCOUNTER — Ambulatory Visit: Admitting: Family Medicine

## 2016-04-28 ENCOUNTER — Ambulatory Visit: Admitting: Family Medicine

## 2016-06-24 ENCOUNTER — Encounter: Payer: Self-pay | Admitting: Family Medicine

## 2016-06-24 ENCOUNTER — Ambulatory Visit (INDEPENDENT_AMBULATORY_CARE_PROVIDER_SITE_OTHER): Payer: 59 | Admitting: Family Medicine

## 2016-06-24 ENCOUNTER — Encounter (INDEPENDENT_AMBULATORY_CARE_PROVIDER_SITE_OTHER): Payer: Self-pay

## 2016-06-24 VITALS — BP 162/88 | HR 69 | Temp 98.2°F | Resp 16 | Ht 71.0 in | Wt 221.0 lb

## 2016-06-24 DIAGNOSIS — E119 Type 2 diabetes mellitus without complications: Secondary | ICD-10-CM | POA: Diagnosis not present

## 2016-06-24 DIAGNOSIS — G8929 Other chronic pain: Secondary | ICD-10-CM | POA: Insufficient documentation

## 2016-06-24 DIAGNOSIS — M25562 Pain in left knee: Secondary | ICD-10-CM | POA: Diagnosis not present

## 2016-06-24 DIAGNOSIS — Z23 Encounter for immunization: Secondary | ICD-10-CM

## 2016-06-24 DIAGNOSIS — E669 Obesity, unspecified: Secondary | ICD-10-CM | POA: Diagnosis not present

## 2016-06-24 DIAGNOSIS — M1712 Unilateral primary osteoarthritis, left knee: Secondary | ICD-10-CM

## 2016-06-24 DIAGNOSIS — I10 Essential (primary) hypertension: Secondary | ICD-10-CM

## 2016-06-24 NOTE — Assessment & Plan Note (Signed)
Well controlled previously on diet and low dose metformin Last A1c 6.2 in 10/2015. Declined repeat A1c today  Plan: 1. Follow-up as planned with PCP for routine DM follow-up within 3-4 months for yearly A1c check, if wt gain or other concerns may return sooner in future for A1c q 6 months

## 2016-06-24 NOTE — Assessment & Plan Note (Addendum)
Etiology for underlying chronic L knee pain, as demonstrated last X-ray 02/2016 - See plan under "Chronic Left Knee Pain"

## 2016-06-24 NOTE — Assessment & Plan Note (Signed)
Stable, chronic left knee pain secondary to multi-compartment OA/DJD (patellofemoral and medial) on last x-ray 02/2016. No known trauma or injury. - Improved with conservative therapy, some inadequate maintenance - No instability or locking concerning for meniscus - No joint effusion, seems more soft tissue surrounding edema  Plan: 1. Advised regular usage of Tylenol for daily aches / pain, recommend using current Naproxen 500 BID for flares 1-2 week then stop PRN 2. RICE therapy, use compression sleeve while active at work (since this has been helping, but he limits its use), start ice packs PRN evenings 3. Stay active, regular aerobic exercise (low impact, elliptical or pool preferred) 4. Follow-up if worsening flare or not improving, consider steroid injection

## 2016-06-24 NOTE — Progress Notes (Signed)
Subjective:    Patient ID: Darrell Abu Sr., male    DOB: 1955/12/08, 60 y.o.   MRN: [id number]  Darrell Havranek Awbrey Sr. is a 60 y.o. male presenting on 06/24/2016 for paperwork  HPI  OBESITY BMI >30 - Presents today to follow-up obesity, and determine plan for appealing the failed [employer] employee health screening with elevated BMI. - Reports wt has been stable to slightly down over past several months, down 2 lbs in 4 months - Exercise: Started regular exercise in summer with push-ups every morning and outdoor yardwork (push mower instead of riding), however he is concerned in winter about colder weather and less outdoor time. 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 Does describe a lot of walking at work, does not count steps. - Diet: Working on improving his diet, now eating salad twice a week and some more fresh fruit, eats at hospital cafeteria for lunch everyday. Home cooked meal in evening. Not following specific low carb or DM diet. 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 Does not have CBG check results Meds: Metformin 500mg  daily On ACEi Denies hypoglycemia, polyuria, visual changes, numbness or tingling.  CHRONIC HTN: Reports checks at home occasionally with wrist cuff ranging from 150-180. Current Meds - Lisinopril 40mg , HCTZ 25mg , Amlodpine 10mg    Reports good compliance, took meds today. Tolerating well, w/o complaints. Denies CP, dyspnea, HA, edema, dizziness / lightheadedness  Chronic Left Knee Pain with OA/DJD: - Reports chronic problem with Left knee only, no known injury or trauma. Diagnosed with arthritis, recently had flare up of pain and swelling. Evaluated at [facility] in 02/2016, flare for >1.5 months at that time, difficulty with  getting in and out of vehicle (repeatedly during day for lab courier) with initial stepping, associated with some swelling. He had L Knee x-rays done, showed arthritis and some possible fluid. Started on Naproxen, knee sleeve and ice. Gradual improvement. Now concern still some lingering symptoms intermittent pain and swelling. Does not regularly wear knee sleeve due to tightness. - No prior knee surgery, injection   Social History  Substance Use Topics  . Smoking status: Never Smoker  . Smokeless tobacco: Never Used  . Alcohol use Yes    Review of Systems Per HPI unless specifically indicated above     Objective:    BP (!) 162/88 (BP Location: Right Arm, Cuff Size: Normal)   Pulse 69   Temp 98.2 F (36.8 C) (Oral)   Resp 16   Ht 5\' 11"  (1.803 m)   Wt 221 lb (100.2 kg)   BMI 30.82 kg/m   Wt Readings from Last 3 Encounters:  06/24/16 221 lb (100.2 kg)  03/03/16 223 lb (101.2 kg)  10/24/15 222 lb 9.6 oz (101 kg)    Physical Exam  Constitutional: He is oriented to person, place, and time. He appears well-developed and well-nourished. No distress.  Well-appearing, comfortable, cooperative  HENT:  Head: Normocephalic and atraumatic.  Mouth/Throat: Oropharynx is clear and moist.  Eyes: Conjunctivae and EOM are normal. Pupils are equal, round, and reactive to light.  Neck: Normal range of motion. Neck supple. No thyromegaly present.  Cardiovascular: Normal rate, regular rhythm, normal heart sounds and intact distal pulses.   No murmur heard. Pulmonary/Chest: Effort normal and breath sounds  normal. No respiratory distress. He has no wheezes. He has no rales.  Abdominal: Soft. Bowel sounds are normal. He exhibits no distension. There is no tenderness.  Musculoskeletal: He exhibits no edema or tenderness.  Left Knee Inspection: Very mild surrounding soft tissue edema Left Knee compared to Right. No effusion. Mostly normal appearance with mild bulkiness. Palpation: Non-tender joint  line bilateral. Mild crepitus ROM: Full active ROM bilaterally Special Testing: Ligaments structurally intact. Strength: 5/5 intact knee flex/ext, ankle dorsi/plantarflex Neurovascular: distally intact sensation light touch and pulses   Neurological: He is alert and oriented to person, place, and time.  Skin: Skin is warm and dry. He is not diaphoretic.  Nursing note and vitals reviewed.  Reviewed Left Knee X-ray results from 02/2016. IMPRESSION: 1. Mild degenerative changes involving the medial and patellofemoral compartments, with associated joint space narrowings and mild osseous spurring. 2. Probable joint effusion. 3. No acute-appearing osseous abnormality.  Reviewed lab results from 10/2015.  Results for orders placed or performed in visit on 10/24/15  Hepatitis C Antibody  Result Value Ref Range   Hep C Virus Ab 0.1 0.0 - 0.9 s/co ratio  POCT HgB A1C  Result Value Ref Range   Hemoglobin A1C 6.2       Assessment & Plan:   Problem List Items Addressed This Visit    Osteoarthritis of left knee    Etiology for underlying chronic L knee pain, as demonstrated last X-ray 02/2016 - See plan under "Chronic Left Knee Pain"      Obesity (BMI 30.0-34.9)    BMI 30, elevated and failed health screening, appealing this today with alternative lifestyle plan. Concern with DM2, HTN.  Plan: 1. Referral to [facility] for nutritionist evaluation, anticipate improvements in DM diet to help weight loss 2. Start regular aerobic exercise - limited to weekends only right now, walking or gym elliptical or pool for less stress on knee - Printed letter and completed patients appeal paperwork      Relevant Orders   Amb ref to Medical Nutrition Therapy-[facility]   HBP (high blood pressure) - Primary    Persistent elevated BP today, systolic Q000111Q, DBP improved. No inciting factors, without acute pain today. Seems consistent with home BP checks of 123456 - No known complications,  asymptomatic  Plan: 1. Continue current Amlodipine 10, HCTZ 25, Lisinopril 40 2. Continue close monitoring BP at home 3. Lifestyle recommendations with nutrition referral and aerobic exercise, perhaps BP may improve with some weight loss 4. Close follow-up within next 4-6 weeks for BP re-check, sooner if persistent elevated at home. Anticipate will likely need 4th anti-HTN agent, consider beta blocker vs hydralazine. In future if still refractory HTN, will consider additional work-up for secondary HTN      Relevant Orders   Amb ref to Medical Nutrition Therapy-[facility]   Diabetes ([place])    Well controlled previously on diet and low dose metformin Last A1c 6.2 in 10/2015. Declined repeat A1c today  Plan: 1. Follow-up as planned with PCP for routine DM follow-up within 3-4 months for yearly A1c check, if wt gain or other concerns may return sooner in future for A1c q 6 months      Relevant Orders   Amb ref to Medical Nutrition Therapy-[facility]   Chronic pain of left knee    Stable, chronic left knee pain secondary to multi-compartment OA/DJD (patellofemoral and medial) on last x-ray 02/2016. No known trauma or injury. - Improved with conservative therapy, some inadequate maintenance - No instability  or locking concerning for meniscus - No joint effusion, seems more soft tissue surrounding edema  Plan: 1. Advised regular usage of Tylenol for daily aches / pain, recommend using current Naproxen 500 BID for flares 1-2 week then stop PRN 2. RICE therapy, use compression sleeve while active at work (since this has been helping, but he limits its use), start ice packs PRN evenings 3. Stay active, regular aerobic exercise (low impact, elliptical or pool preferred) 4. Follow-up if worsening flare or not improving, consider steroid injection       Other Visit Diagnoses    Needs flu shot       Relevant Orders   Flu Vaccine QUAD 36+ mos IM (Completed)      No orders of the defined types were placed  in this encounter.     Follow up plan: Return in about 6 weeks (around 08/05/2016) for blood pressure, weight.  [name], DO [facility] 06/24/2016, 5:46 PM

## 2016-06-24 NOTE — Patient Instructions (Signed)
Thank you for coming in to clinic today.  1. For your weight, recommend the following - Regular aerobic brisk walking or gym elliptical machine exercise at least twice a week (weekends to start) for up to 30 to 45 min each time, can do longer if tolerated - Referral to Nutritionist at [facility], discuss diet, advise low carb diet, reduce bread and starches, increase fresh vegetables, reduce calories in drinks. May need to cut back on Smirnoff Ice as this has a lot of sugar in it  2. Blood pressure - Remains elevated, no change to meds today  - Start lifestyle recommendations as discussed - Check at home, WRITE down readings and bring to next visit   3. Left Knee Arthritis - You have wear and tear arthritis in 2 compartments of your knee - This causes swelling, this is normal and will come and go - Wear knee compression sleeve during active times of day - Use ice packs and elevation to reduce swelling - Tylenol is safe for regular aches and pains  For a flare up, Recommend trial of Anti-inflammatory with Naproxen (Naprosyn) 500mg  tabs - take one with food and plenty of water TWICE daily every day (breakfast and dinner), for next 1-2 weeks, then you may take only as needed - DO NOT TAKE any ibuprofen, aleve, motrin while you are taking this medicine  - It is safe to take Tylenol Ext Str 500mg  tabs - take 1 to 2 (max dose 1000mg ) every 6 hours as needed for breakthrough pain, max 24 hour daily dose is 6 to 8 tablets or 4000mg   If future if not improving, return and we can discuss other options, physical therapy, steroid injection etc.   NUTRITION Referral placed  [facility]   Address: [address] [place], [place], [place] [address] Phone: [phone]   Please schedule a follow-up appointment with Dr. Luan Pulling in 4 to 6 weeks to re-check Blood Pressure and follow-up Weight  If you have any other questions or concerns, please feel free to call the clinic or send a message  through [facility]. You may also schedule an earlier appointment if necessary.  Darrell Putnam, DO [facility]

## 2016-06-24 NOTE — Assessment & Plan Note (Signed)
Persistent elevated BP today, systolic Q000111Q, DBP improved. No inciting factors, without acute pain today. Seems consistent with home BP checks of 123456 - No known complications, asymptomatic  Plan: 1. Continue current Amlodipine 10, HCTZ 25, Lisinopril 40 2. Continue close monitoring BP at home 3. Lifestyle recommendations with nutrition referral and aerobic exercise, perhaps BP may improve with some weight loss 4. Close follow-up within next 4-6 weeks for BP re-check, sooner if persistent elevated at home. Anticipate will likely need 4th anti-HTN agent, consider beta blocker vs hydralazine. In future if still refractory HTN, will consider additional work-up for secondary HTN

## 2016-06-24 NOTE — Assessment & Plan Note (Signed)
BMI 30, elevated and failed health screening, appealing this today with alternative lifestyle plan. Concern with DM2, HTN.  Plan: 1. Referral to [facility] for nutritionist evaluation, anticipate improvements in DM diet to help weight loss 2. Start regular aerobic exercise - limited to weekends only right now, walking or gym elliptical or pool for less stress on knee - Printed letter and completed patients appeal paperwork

## 2016-09-07 ENCOUNTER — Ambulatory Visit (INDEPENDENT_AMBULATORY_CARE_PROVIDER_SITE_OTHER): Payer: 59 | Admitting: Family Medicine

## 2016-09-07 ENCOUNTER — Encounter: Payer: Self-pay | Admitting: Family Medicine

## 2016-09-07 VITALS — BP 160/90 | HR 99 | Temp 98.9°F | Resp 16 | Ht 71.0 in | Wt 228.0 lb

## 2016-09-07 DIAGNOSIS — Z0001 Encounter for general adult medical examination with abnormal findings: Secondary | ICD-10-CM

## 2016-09-07 DIAGNOSIS — I10 Essential (primary) hypertension: Secondary | ICD-10-CM | POA: Diagnosis not present

## 2016-09-07 DIAGNOSIS — E1169 Type 2 diabetes mellitus with other specified complication: Secondary | ICD-10-CM | POA: Diagnosis not present

## 2016-09-07 DIAGNOSIS — E669 Obesity, unspecified: Secondary | ICD-10-CM

## 2016-09-07 DIAGNOSIS — D473 Essential (hemorrhagic) thrombocythemia: Secondary | ICD-10-CM | POA: Insufficient documentation

## 2016-09-07 DIAGNOSIS — E785 Hyperlipidemia, unspecified: Secondary | ICD-10-CM | POA: Diagnosis not present

## 2016-09-07 DIAGNOSIS — D75839 Thrombocytosis, unspecified: Secondary | ICD-10-CM | POA: Insufficient documentation

## 2016-09-07 DIAGNOSIS — N529 Male erectile dysfunction, unspecified: Secondary | ICD-10-CM

## 2016-09-07 DIAGNOSIS — E119 Type 2 diabetes mellitus without complications: Secondary | ICD-10-CM

## 2016-09-07 DIAGNOSIS — M1712 Unilateral primary osteoarthritis, left knee: Secondary | ICD-10-CM

## 2016-09-07 DIAGNOSIS — Z0000 Encounter for general adult medical examination without abnormal findings: Secondary | ICD-10-CM

## 2016-09-07 LAB — POCT GLYCOSYLATED HEMOGLOBIN (HGB A1C): HEMOGLOBIN A1C: 6.2

## 2016-09-07 MED ORDER — ASPIRIN EC 81 MG PO TBEC
81.0000 mg | DELAYED_RELEASE_TABLET | Freq: Every day | ORAL | Status: AC
Start: 2016-09-07 — End: ?

## 2016-09-07 MED ORDER — METOPROLOL SUCCINATE ER 25 MG PO TB24: 25.0000 mg | ORAL_TABLET | Freq: Every day | ORAL | 5 refills | Status: DC

## 2016-09-07 MED ORDER — LISINOPRIL 40 MG PO TABS: 40.0000 mg | ORAL_TABLET | Freq: Every day | ORAL | 3 refills | Status: DC

## 2016-09-07 MED ORDER — HYDROCHLOROTHIAZIDE 25 MG PO TABS: 25.0000 mg | ORAL_TABLET | Freq: Every day | ORAL | 3 refills | Status: DC

## 2016-09-07 MED ORDER — AMLODIPINE BESYLATE 10 MG PO TABS: ORAL_TABLET | ORAL | 3 refills | Status: DC

## 2016-09-07 NOTE — Assessment & Plan Note (Signed)
Last lipids 07/2015 with elevated LDL 131, inc total chol, good HDL. Never on statin therapy Using old data, calculated ASCVD 10 yr risk >18% significantly elevated with uncontrolled HTN, BP 160 today Given DM, and especially with inc ASCVD risk score, patient is at high risk for MI/CVA, discussed this in detail today  Plan: 1. Check fasting lipids 2. Start ASA 81mg  daily for prevention 3. Advised he is indicated to start cholesterol therapy with statin. However since new start anti-HTN therapy today and ASA, patient would like to hold off on statin despite reviewing benefits/risks 4. Follow-up lipids, discuss again 4 weeks and at future follow-up, may also refer to Cardiology to reinforce this concept of reducing future risk of ASCVD

## 2016-09-07 NOTE — Assessment & Plan Note (Signed)
Stable, remains well controlled on low dose metformin A1c 6.2, unchanged >1 year  Plan: 1. Continue Metformin 500mg  daily, consider titrate up in future if elevated A1c or other concerns 2. Start ASA 81mg  daily 3. Offered statin therapy, recommended this based on dx of DM and elevated ASCVD risk, given patient info on statins, consider starting in future 4. Lifestyle recommendations, f/u with [facility] 5. Follow-up 6 months DM

## 2016-09-07 NOTE — Assessment & Plan Note (Signed)
BMI 31, elevated and still weight gain. Concern with DM2, uncontrolled HTN.  Plan: 1. Advised to call and schedule apt with [facility] for nutritionist evaluation (he did not schedule last time due to work scheduling conflict) 2. Again advised to start regular aerobic exercise - likely limited to weekends only right now, walking or gym elliptical or pool for less stress on knee

## 2016-09-07 NOTE — Assessment & Plan Note (Signed)
Persistent worsening elevated BP today, >160/90 on manual re-check. Did not adhere to checking BP at home, does not have log today. Continues poor lifestyle habits (poor diet / no regular exercise) - No known complications, asymptomatic - Not on ASA, statin, concern with inc risk ASCVD uncontrolled BP - Concern for possible secondary etiology for HTN (consider thyroid, hyperaldo, OSA), given refractory to 3 medications, seems to be adhering to therapy  Plan: 1. Start new Metoprolol-Succinate 25mg  daily today, anticipate may need up to 50-100mg  daily in future if tolerated well, cautious with metabolic side effects and possible fatigue 2. Continue current Amlodipine 10, HCTZ 25, Lisinopril 40 3. Check labs today chemistry, lipids, and secondary HTN labs with TSH and aldosterone / plasma renin 4. Emphasized importance of home BP monitoring, keep hand written log, check daily, bring to next visit 5. Again reviewed lifestyle recommendations adding aerobic exercise, patient to call [facility], low sodium diet, wt loss 6. Close follow-up within next 4 weeks for BP follow-up, may titrate up Metoprolol, review home BP, lab results, if still resistant HTN and labs unremarkable, will proceed with likely sleep study for suspected sleep apnea, and possible referral to Cardiology for additional assistance

## 2016-09-07 NOTE — Assessment & Plan Note (Signed)
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 - Unclear if loss sexual desire related to ED or if it is cause of ED, will discuss further in future, currently my main priority is controlling his HTN, prior to proceed with management for ED - Follow-up in future, may consider repeat trial on sildenafil vs urology referral

## 2016-09-07 NOTE — Assessment & Plan Note (Signed)
Stable chronic L Knee pain without known injury or trauma with known knee OA/DJD (medial / patellofemoral) - No prior history of knee surgery, arthroscopy - Improved on conservative therapy, sometimes inadequate dosing - Last L knee x-rays 02/2016  Plan: 1. Reassurance on current conservative plan may use OTC Aleve 1-2 pills up to BID with food as needed, max dose up to 1 week at a time for flare, then stop, cautioned on overuse NSAIDs, currently seems using very infrequently which is fine but may not be controlling arthritis. 2. Increase use of PRN Tylenol Extra Strength, given dosing recs 3. Adhere to RICE therapy (rest, ice, compression, elevation) for swelling, activity modification 4. Follow-up PRN if still worsening, consider steroid injection

## 2016-09-07 NOTE — Progress Notes (Signed)
Subjective:    Patient ID: Darrell Abu Sr., male    DOB: 1956-02-09, 60 y.o.   MRN: [id number]  Darrell Wenrick Henner Sr. is a 60 y.o. male presenting on 09/07/2016 for Annual Exam   HPI   ANNUAL PHYSICAL / Lifestyle / OBESITY BMI >31 - Patient is a Armed forces logistics/support/administrative officer, and last visit followed up for obesity given failed [facility] metrics to determine plan for appealing - He is not fasting today, and will obtain fasting labs tomorrow at [facility] facility, requests orders today - Today with some weight gain +5-7 lbs over 2-3 months - Exercise: Less active in winter time (not doing outdoor yardwork as much), does 25 push ups and pull ups every weekday morning, active walking at work as [employer] but only short distance walking not increased heart rate or sweating or significant exertion - He did not follow-up with [employer] (due to job scheduling conflict) - Diet: Still working on improving his diet, now eating salad twice a week and some more fresh fruit, eats at hospital cafeteria for lunch everyday. Home cooked meal in evening. Not following specific low carb or DM diet. Previously drinking frequent Smirnoff ice winecooler 3 x weekly  CHRONIC DM, Type 2: Reports no new concerns. A1c due today, last 10/2015 Does not have CBG check results Meds: Metformin 500mg  daily, tolerating well On ACEi Denies hypoglycemia, polyuria, visual changes, numbness or tingling.  CHRONIC HTN: Reports he has not regularly checked BP since last visit 06/2016, he has wrist cuff at home, previous readings months ago were SBP 150-180. - He expresses concern about Lisinopril affecting his erections, see below. Current Meds - Lisinopril 40mg , HCTZ 25mg , Amlodpine 10mg  Reports good compliance, took meds today. Tolerating well, w/o complaints. - Caffeine: Drinks half cup coffee everyday, no tea or soda - Diet: Eats some foods high in sodium, often has salty bacon or ham in morning - Admits to  difficulty breathing at night often with sinuses, unsure if sleep apnea, has never had a sleep study before, does admit to snoring, will wake up occasionally, but does not know if has problem for apnea Denies CP, dyspnea, HA, edema, dizziness / lightheadedness  HYPERLIPIDEMIA: - Reports last lipid panel 07/2015 with elevated LDL, never on statin therapy or other cholesterol medication. He has not discussed this with other provider. Not on any supplement. - See exercise above - He has never taken Aspirin regularly for prevention, no significant known CAD / MI history in family  Erectile Dysfunction - Reports concern for this issue has been present for a while, many years. He has been on Cialis or Sildenafil (Viagra) in the past, usually low doses, only taking 1 pill may not have titrated dose up, he is unsure of prior dosing has been a while since he last used these medications. Only moderate improvement with erections not every time. He thinks most problem is with obtaining erections, can still get erection, but seems to think he has issue with sexual desire and drive as well with decreased interest in sex, but thinks this may be due to decreased erections.  Chronic Left Knee Pain with OA/DJD: - Reports chronic problem with Left knee only, no known injury or trauma. - Last visit 06/2016, reviewed this problem, he had recent L knee x-rays 02/2016 with medial and patellofemoral mild DJD. He gets temporary relief from Aleve OTC 1 pill daily about 2x weekly. Not taking regular Tylenol. Occasioanlly will have some swelling PRN. Has knee sleeve  but does not wear regularly. - No prior surgery or injection - Denies any new injury, fall, redness, swelling   Past Medical History:  Diagnosis Date  . Allergy   . Gout   . Hypertension   . Obesity   . Thrombocytosis [facility])    Social History   Social History  . Marital status: Married    Spouse name: N/A  . Number of children: N/A  . Years of  education: N/A   Occupational History  . Not on file.   Social History Main Topics  . Smoking status: Never Smoker  . Smokeless tobacco: Never Used  . Alcohol use Yes  . Drug use: No  . Sexual activity: Not on file   Other Topics Concern  . Not on file   Social History Narrative  . No narrative on file   Family History  Problem Relation Age of Onset  . Diabetes Mother    Current Outpatient Prescriptions on File Prior to Visit  Medication Sig  . colchicine 0.6 MG tablet Take 1 tablet (0.6 mg total) by mouth daily. (Patient taking differently: Take 0.6 mg by mouth daily as needed. )  . Elastic Bandages & Supports (THUMB SPLINT/RIGHT LARGE) MISC 1 each by Does not apply route daily.  . Hydrocodone-Acetaminophen 7.5-300 MG TABS Take 1 tablet by mouth daily as needed.   . metFORMIN (GLUCOPHAGE) 500 MG tablet Take 1 tablet (500 mg total) by mouth daily with breakfast.  . ONE TOUCH ULTRA TEST test strip   . ONETOUCH DELICA LANCETS 99991111 MISC   . terbinafine (LAMISIL AT) 1 % cream Apply 1 application topically 2 (two) times daily.   No current facility-administered medications on file prior to visit.     Review of Systems  Constitutional: Negative for activity change, appetite change, chills, diaphoresis, fatigue, fever and unexpected weight change.  HENT: Negative for congestion, hearing loss and sinus pressure.   Eyes: Negative for visual disturbance.  Respiratory: Negative for cough, chest tightness, shortness of breath and wheezing.   Cardiovascular: Negative for chest pain, palpitations and leg swelling.  Gastrointestinal: Negative for abdominal pain, constipation, diarrhea, nausea and vomiting.  Endocrine: Positive for heat intolerance. Negative for polydipsia and polyuria.  Genitourinary: Negative for difficulty urinating, dysuria, frequency and hematuria.  Musculoskeletal: Positive for arthralgias (Left knee occasional pain). Negative for gait problem and neck pain.  Skin:  Negative for rash.  Allergic/Immunologic: Positive for environmental allergies.  Neurological: Negative for dizziness, weakness, light-headedness, numbness and headaches.  Hematological: Negative for adenopathy.  Psychiatric/Behavioral: Negative for behavioral problems, decreased concentration, dysphoric mood and sleep disturbance. The patient is not nervous/anxious.    Per HPI unless specifically indicated above    Objective:    BP (!) 160/90 (BP Location: Left Arm, Cuff Size: Normal)   Pulse 99   Temp 98.9 F (37.2 C) (Oral)   Resp 16   Ht 5\' 11"  (1.803 m)   Wt 228 lb (103.4 kg)   BMI 31.80 kg/m   Wt Readings from Last 3 Encounters:  09/07/16 228 lb (103.4 kg)  06/24/16 221 lb (100.2 kg)  03/03/16 223 lb (101.2 kg)    Physical Exam  Constitutional: He is oriented to person, place, and time. He appears well-developed and well-nourished. No distress.  Well-appearing, comfortable, cooperative, muscular build  HENT:  Head: Normocephalic and atraumatic.  Mouth/Throat: Oropharynx is clear and moist.  Eyes: Conjunctivae and EOM are normal. Pupils are equal, round, and reactive to light.  Neck: Normal range of  motion. Neck supple. No thyromegaly present.  Cardiovascular: Regular rhythm, normal heart sounds and intact distal pulses.   No murmur heard. Tachycardic  Pulmonary/Chest: Effort normal and breath sounds normal. No respiratory distress. He has no wheezes. He has no rales.  Abdominal: Soft. Bowel sounds are normal. He exhibits no distension. There is no tenderness.  Musculoskeletal: He exhibits no edema or tenderness.  Left Knee - Stable since last exam Inspection: Improved soft tissue edema since last visit Palpation: Non-tender joint line bilateral. Mild crepitus ROM: Full active ROM bilaterally Special Testing: Ligaments structurally intact. Strength: 5/5 intact knee flex/ext, ankle dorsi/plantarflex Neurovascular: distally intact sensation light touch and pulses     Lymphadenopathy:    He has no cervical adenopathy.  Neurological: He is alert and oriented to person, place, and time. Coordination normal.  Skin: Skin is warm and dry. He is not diaphoretic.  Psychiatric: He has a normal mood and affect. His behavior is normal. Thought content normal.  Nursing note and vitals reviewed.  Results for orders placed or performed in visit on 09/07/16  POCT HgB A1C  Result Value Ref Range   Hemoglobin A1C 6.2       Assessment & Plan:   Problem List Items Addressed This Visit    Thrombocytosis ([place])    History of thrombocytosis, with last plt 420 (07/2015), he was followed for labs previously and saw Heme/Onc, but does not know the plan or treatment. - Check CBC today to follow-up      Relevant Orders   CBC with Differential/Platelet   Resistant hypertension    Persistent worsening elevated BP today, >160/90 on manual re-check. Did not adhere to checking BP at home, does not have log today. Continues poor lifestyle habits (poor diet / no regular exercise) - No known complications, asymptomatic - Not on ASA, statin, concern with inc risk ASCVD uncontrolled BP - Concern for possible secondary etiology for HTN (consider thyroid, hyperaldo, OSA), given refractory to 3 medications, seems to be adhering to therapy  Plan: 1. Start new Metoprolol-Succinate 25mg  daily today, anticipate may need up to 50-100mg  daily in future if tolerated well, cautious with metabolic side effects and possible fatigue 2. Continue current Amlodipine 10, HCTZ 25, Lisinopril 40 3. Check labs today chemistry, lipids, and secondary HTN labs with TSH and aldosterone / plasma renin 4. Emphasized importance of home BP monitoring, keep hand written log, check daily, bring to next visit 5. Again reviewed lifestyle recommendations adding aerobic exercise, patient to call [place], low sodium diet, wt loss 6. Close follow-up within next 4 weeks for BP follow-up, may titrate up  Metoprolol, review home BP, lab results, if still resistant HTN and labs unremarkable, will proceed with likely sleep study for suspected sleep apnea, and possible referral to Cardiology for additional assistance      Relevant Medications   lisinopril (PRINIVIL,ZESTRIL) 40 MG tablet   hydrochlorothiazide (HYDRODIURIL) 25 MG tablet   amLODipine (NORVASC) 10 MG tablet   metoprolol succinate (TOPROL-XL) 25 MG 24 hr tablet   aspirin EC 81 MG tablet   Other Relevant Orders   Comprehensive metabolic panel   TSH   Aldosterone + renin activity w/ ratio   Osteoarthritis of left knee    Stable chronic L Knee pain without known injury or trauma with known knee OA/DJD (medial / patellofemoral) - No prior history of knee surgery, arthroscopy - Improved on conservative therapy, sometimes inadequate dosing - Last L knee x-rays 02/2016  Plan: 1. Reassurance on  current conservative plan may use OTC Aleve 1-2 pills up to BID with food as needed, max dose up to 1 week at a time for flare, then stop, cautioned on overuse NSAIDs, currently seems using very infrequently which is fine but may not be controlling arthritis. 2. Increase use of PRN Tylenol Extra Strength, given dosing recs 3. Adhere to RICE therapy (rest, ice, compression, elevation) for swelling, activity modification 4. Follow-up PRN if still worsening, consider steroid injection      Relevant Medications   aspirin EC 81 MG tablet   Obesity (BMI 30.0-34.9)    BMI 31, elevated and still weight gain. Concern with DM2, uncontrolled HTN.  Plan: 1. Advised to call and schedule apt with [place] for nutritionist evaluation (he did not schedule last time due to work scheduling conflict) 2. Again advised to start regular aerobic exercise - likely limited to weekends only right now, walking or gym elliptical or pool for less stress on knee      Hyperlipidemia associated with type 2 diabetes mellitus ([place])    Last lipids 07/2015 with  elevated LDL 131, inc total chol, good HDL. Never on statin therapy Using old data, calculated ASCVD 10 yr risk >18% significantly elevated with uncontrolled HTN, BP 160 today Given DM, and especially with inc ASCVD risk score, patient is at high risk for MI/CVA, discussed this in detail today  Plan: 1. Check fasting lipids 2. Start ASA 81mg  daily for prevention 3. Advised he is indicated to start cholesterol therapy with statin. However since new start anti-HTN therapy today and ASA, patient would like to hold off on statin despite reviewing benefits/risks 4. Follow-up lipids, discuss again 4 weeks and at future follow-up, may also refer to Cardiology to reinforce this concept of reducing future risk of ASCVD      Relevant Medications   lisinopril (PRINIVIL,ZESTRIL) 40 MG tablet   hydrochlorothiazide (HYDRODIURIL) 25 MG tablet   amLODipine (NORVASC) 10 MG tablet   metoprolol succinate (TOPROL-XL) 25 MG 24 hr tablet   aspirin EC 81 MG tablet   Other Relevant Orders   Lipid panel   Erectile dysfunction    Consistent with likely age-related vs vascular ED due to HTN, DM, HLD. - Unclear if loss sexual desire related to ED or if it is cause of ED, will discuss further in future, currently my main priority is controlling his HTN, prior to proceed with management for ED - Follow-up in future, may consider repeat trial on sildenafil vs urology referral      Diabetes ([place])    Stable, remains well controlled on low dose metformin A1c 6.2, unchanged >1 year  Plan: 1. Continue Metformin 500mg  daily, consider titrate up in future if elevated A1c or other concerns 2. Start ASA 81mg  daily 3. Offered statin therapy, recommended this based on dx of DM and elevated ASCVD risk, given patient info on statins, consider starting in future 4. Lifestyle recommendations, f/u with [facility] 5. Follow-up 6 months DM      Relevant Medications   lisinopril (PRINIVIL,ZESTRIL) 40 MG tablet    aspirin EC 81 MG tablet   Other Relevant Orders   POCT HgB A1C (Completed)   Comprehensive metabolic panel   Annual physical exam - Primary      Meds ordered this encounter  Medications  . lisinopril (PRINIVIL,ZESTRIL) 40 MG tablet    Sig: Take 1 tablet (40 mg total) by mouth daily.    Dispense:  90 tablet  Refill:  3  . hydrochlorothiazide (HYDRODIURIL) 25 MG tablet    Sig: Take 1 tablet (25 mg total) by mouth daily.    Dispense:  90 tablet    Refill:  3  . amLODipine (NORVASC) 10 MG tablet    Sig: Take 1 tablet daily for BP    Dispense:  90 tablet    Refill:  3  . metoprolol succinate (TOPROL-XL) 25 MG 24 hr tablet    Sig: Take 1 tablet (25 mg total) by mouth daily.    Dispense:  30 tablet    Refill:  5  . aspirin EC 81 MG tablet    Sig: Take 1 tablet (81 mg total) by mouth daily.      Follow up plan: Return in about 4 weeks (around 10/05/2016) for blood pressure.  [name], [facility] 09/07/2016, 11:04 AM

## 2016-09-07 NOTE — Patient Instructions (Signed)
Thank you for coming in to clinic today.  1. For your weight, recommend the following - Regular aerobic brisk walking or gym elliptical machine exercise at least twice a week (weekends to start) for up to 30 to 45 min each time, can do longer if tolerated - Try to contact [place] to reschedule apt to discuss healthy nutrition, try to focus on LOW SALT DIET, reduce salty meats such as bacon and ham. Also reduce alcohol intake Address: [address] [place], [place], [place] [address] Phone: [phone]  2. Blood pressure - Remains significantly elevated, go ahead and start new med, Metoprolol Succinate (24 hour pill) once daily 25mg , this is low dose and the 24 hour type will have less side effects, it should not make you too sleepy or drowsy. The alternative pill for your current blood pressure would have required up to 3 to 4 times a day dosing  - Start lifestyle recommendations as discussed - Check at home once a day (different times), WRITE down readings and bring to next visit  You will be due for FASTING BLOOD WORK (no food or drink after midnight before, only water or coffee without cream/sugar on the morning of)  Consider starting Statin blood pressure pill, Atorvastatin, Lipitor, Crestor, Rosuvastatin, consider these for the future  - Please go ahead and get blood drawn tomorrow (FASTING) at [facility], I will review results later this week and get back to you on the plan, we may need referral to Cardiology first to help figure out how to help control your BP - also may need a SLEEP STUDY in near future, check with your insurance to see if preferred at [place] or Home Sleep Study  For Lab Results, once available within 2-3 days of blood draw, you can can log in to MyChart online to view your results and a brief explanation. Also, we can discuss results at next follow-up visit.   3. Left Knee Arthritis - Take OTC Aleve 1-2 pills 1 to 2 times a day for several days as  needed, do not take for longer than 1 week, if need to use more often, let me know and we may need to re-evaluate knee consider steroid injection - Recommend to start taking Tylenol Extra Strength 500mg  tabs - take 1 to 2 tabs per dose (max 1000mg ) every 6-8 hours for pain (take regularly, don't skip a dose for next 7 days), max 24 hour daily dose is 6 tablets or 3000mg . In the future you can repeat the same everyday Tylenol course for 1-2 weeks at a time.  - This is safe to take with anti-inflammatory medicines  Aleve - Wear knee compression sleeve during active times of day - Use ice packs and elevation to reduce swelling - Tylenol is safe for regular aches and pains   Please schedule a follow-up appointment with Dr. Parks Ranger in 4 to 6 weeks to re-check Blood Pressure  If you have any other questions or concerns, please feel free to call the clinic or send a message through [place]. You may also schedule an earlier appointment if necessary.  Darrell Putnam, DO [place]

## 2016-09-07 NOTE — Assessment & Plan Note (Addendum)
History of thrombocytosis, with last plt 420 (07/2015), he was followed for labs previously and saw Heme/Onc, but does not know the plan or treatment. - Check CBC today to follow-up

## 2016-09-12 LAB — IRON AND TIBC
IRON SATURATION: 20 % (ref 15–55)
IRON: 74 ug/dL (ref 38–169)
Total Iron Binding Capacity: 373 ug/dL (ref 250–450)
UIBC: 299 ug/dL (ref 111–343)

## 2016-09-12 LAB — SPECIMEN STATUS REPORT

## 2016-09-12 LAB — C-REACTIVE PROTEIN: CRP: 3.5 mg/L (ref 0.0–4.9)

## 2016-09-12 LAB — FERRITIN: FERRITIN: 285 ng/mL (ref 30–400)

## 2016-09-15 LAB — COMPREHENSIVE METABOLIC PANEL
A/G RATIO: 1.7 (ref 1.2–2.2)
ALK PHOS: 48 IU/L (ref 39–117)
ALT: 22 IU/L (ref 0–44)
AST: 17 IU/L (ref 0–40)
Albumin: 4.3 g/dL (ref 3.5–5.5)
BUN/Creatinine Ratio: 15 (ref 9–20)
BUN: 12 mg/dL (ref 6–24)
Bilirubin Total: 0.4 mg/dL (ref 0.0–1.2)
CALCIUM: 9.4 mg/dL (ref 8.7–10.2)
CHLORIDE: 101 mmol/L (ref 96–106)
CO2: 25 mmol/L (ref 18–29)
Creatinine, Ser: 0.8 mg/dL (ref 0.76–1.27)
GFR calc Af Amer: 113 mL/min/{1.73_m2} (ref >59)
GFR, EST NON AFRICAN AMERICAN: 98 mL/min/{1.73_m2} (ref >59)
Globulin, Total: 2.6 g/dL (ref 1.5–4.5)
Glucose: 113 mg/dL — ABNORMAL HIGH (ref 65–99)
POTASSIUM: 4 mmol/L (ref 3.5–5.2)
Sodium: 142 mmol/L (ref 134–144)
Total Protein: 6.9 g/dL (ref 6.0–8.5)

## 2016-09-15 LAB — CBC WITH DIFFERENTIAL/PLATELET
BASOS ABS: 0.1 10*3/uL (ref 0.0–0.2)
Basos: 1 %
EOS (ABSOLUTE): 0.4 10*3/uL (ref 0.0–0.4)
Eos: 10 %
HEMOGLOBIN: 13.8 g/dL (ref 13.0–17.7)
Hematocrit: 43.7 % (ref 37.5–51.0)
IMMATURE GRANS (ABS): 0 10*3/uL (ref 0.0–0.1)
IMMATURE GRANULOCYTES: 0 %
LYMPHS: 41 %
Lymphocytes Absolute: 1.8 10*3/uL (ref 0.7–3.1)
MCH: 27.5 pg (ref 26.6–33.0)
MCHC: 31.6 g/dL (ref 31.5–35.7)
MCV: 87 fL (ref 79–97)
MONOCYTES: 9 %
Monocytes Absolute: 0.4 10*3/uL (ref 0.1–0.9)
NEUTROS PCT: 39 %
Neutrophils Absolute: 1.7 10*3/uL (ref 1.4–7.0)
PLATELETS: 452 10*3/uL — AB (ref 150–379)
RBC: 5.01 x10E6/uL (ref 4.14–5.80)
RDW: 13.7 % (ref 12.3–15.4)
WBC: 4.4 10*3/uL (ref 3.4–10.8)

## 2016-09-15 LAB — LIPID PANEL
CHOL/HDL RATIO: 3.3 ratio (ref 0.0–5.0)
CHOLESTEROL TOTAL: 215 mg/dL — AB (ref 100–199)
HDL: 65 mg/dL (ref >39)
LDL Calculated: 134 mg/dL — ABNORMAL HIGH (ref 0–99)
TRIGLYCERIDES: 82 mg/dL (ref 0–149)
VLDL Cholesterol Cal: 16 mg/dL (ref 5–40)

## 2016-09-15 LAB — ALDOSTERONE + RENIN ACTIVITY W/ RATIO
ALDOS/RENIN RATIO: 48.5 — AB (ref 0.0–30.0)
ALDOSTERONE: 11.6 ng/dL (ref 0.0–30.0)
RENIN: 0.239 ng/mL/h (ref 0.167–5.380)

## 2016-09-15 LAB — TSH: TSH: 1.28 u[IU]/mL (ref 0.450–4.500)

## 2016-09-17 ENCOUNTER — Telehealth: Payer: Self-pay | Admitting: Family Medicine

## 2016-09-17 DIAGNOSIS — I10 Essential (primary) hypertension: Secondary | ICD-10-CM

## 2016-09-17 NOTE — Telephone Encounter (Signed)
Last visit 09/07/16 for annual physical, see note for full details on Resistant HTN diagnosis, have obtained variety of labs for further work-up into possible secondary causes of resistant HTN, including aldosterone labs to check for hyperaldosterone.  Reviewed abnormal results recently with Aldosterone 11.6, Renin activity 0.239, and Aldosterone/Renin ratio elevated at 48.5. After further consideration, it seems patient would benefit from further Endocrinology evaluation and testing, possibly imaging in future. Other secondary etiology of HTN so far unremarkable, except still considering OSA among other possibilities.  [facility] Endocrinology today 12/28 to review these abnormal lab results and get their opinion on utility of Endocrine referral at this point for further Hyperaldo work-up, or if any other testing needed / recommended at this point. Left message and will be awaiting call back.  If I am out of office, please take message with further recommendations from Endocrine, and if referral is needed will go ahead and place orders. Lastly will release these results to patient with this further advice once we receive it.  [name], [facility] 09/17/2016, 2:51 PM

## 2016-09-17 NOTE — Telephone Encounter (Signed)
Referral placed and okay with endocrine for further work-up.

## 2016-10-05 ENCOUNTER — Ambulatory Visit: Payer: 59 | Admitting: Family Medicine

## 2017-01-06 ENCOUNTER — Encounter: Payer: Self-pay | Admitting: Family Medicine

## 2017-01-06 ENCOUNTER — Ambulatory Visit (INDEPENDENT_AMBULATORY_CARE_PROVIDER_SITE_OTHER): Payer: 59 | Admitting: Family Medicine

## 2017-01-06 VITALS — BP 157/89 | HR 66 | Temp 98.3°F | Resp 16 | Ht 71.0 in | Wt 229.0 lb

## 2017-01-06 DIAGNOSIS — M1712 Unilateral primary osteoarthritis, left knee: Secondary | ICD-10-CM

## 2017-01-06 DIAGNOSIS — I10 Essential (primary) hypertension: Secondary | ICD-10-CM

## 2017-01-06 DIAGNOSIS — M25562 Pain in left knee: Secondary | ICD-10-CM | POA: Diagnosis not present

## 2017-01-06 DIAGNOSIS — G8929 Other chronic pain: Secondary | ICD-10-CM | POA: Diagnosis not present

## 2017-01-06 DIAGNOSIS — I1A0 Resistant hypertension: Secondary | ICD-10-CM

## 2017-01-06 MED ORDER — LIDOCAINE HCL (PF) 1 % IJ SOLN
4.0000 mL | Freq: Once | INTRAMUSCULAR | Status: AC
  Administered 2017-01-06: 4 mL

## 2017-01-06 MED ORDER — METHYLPREDNISOLONE ACETATE 40 MG/ML IJ SUSP
40.0000 mg | Freq: Once | INTRAMUSCULAR | Status: AC
  Administered 2017-01-06: 40 mg via INTRA_ARTICULAR

## 2017-01-06 MED ORDER — NAPROXEN 500 MG PO TABS: 500.0000 mg | ORAL_TABLET | Freq: Two times a day (BID) | ORAL | 2 refills | Status: DC

## 2017-01-06 NOTE — Assessment & Plan Note (Signed)
Stable to gradual worsening, chronic left knee pain secondary to multi-compartment OA/DJD (patellofemoral and medial) on last x-ray 02/2016. No new trauma or injury. - Improved with conservative therapy, some inadequate maintenance - No instability or locking concerning for meniscus  Plan: 1. S/p corticosteroid injection today 01/06/17, see procedure note for details - limit activity for 24 hours per handout 2. Start rx Naproxen 500mg  BID for flares only 2-4 weeks PRN 3. Otherwise again advised regular Tylenol use for daily or mild pains 4. RICE therapy, use compression sleeve can use brace as well if needed 5. Stay active, regular aerobic exercise (low impact, elliptical or pool preferred) 6. Follow-up 6 weeks after injection - future consider PT, Ortho repeat imaging, unlikely ligament or meniscus injury at this time

## 2017-01-06 NOTE — Progress Notes (Signed)
Subjective:    Patient ID: Darrell Abu Sr., male    DOB: 1955/09/29, 61 y.o.   MRN: [id number]  Darrell Hicks Fichera Sr. is a 61 y.o. male presenting on 01/06/2017 for Knee Pain (Left side getting worst past 2 month)   HPI   CHRONIC HTN: Reports since last visit checking BP at home still ranging elevated SBP 140-160. Admits eating bacon and salty foods, pork rinds, thinks this can be elevating BP. Current Meds - Lisinopril 40mg , HCTZ 25mg , Amlodpine 10mg  Reports good compliance, took meds today. Tolerating well, w/o complaints. - He is concerned about ED on these meds. - Caffeine: Drinks half cup coffee everyday, no tea or soda - Not having worsening of breathing difficulties at night, last discussed about possible sleep apnea Denies CP, dyspnea, HA, edema, dizziness / lightheadedness  Chronic Left Knee Pain with OA/DJD: - Last visit seen 06/2016 and 08/2016 discussed this problem, no known prior history of significant Left Knee injury or trauma. Not established with Orthopedics, no prior injection or surgery. - Currently describes problem with overall gradual worsening over past 8 months. Now significant worsening over past 3 months, pain described as sharp pain up to 6/10 brief episode usually worse after stiffness prolonged seated and standing, or at night will occur as well, somewhat improved with activity and walking, but prolonged  - Admits some occasional swelling, usually self limited - Takes Naproxen 500mg  daily in AM after breakfast, for past 3 months, some minor improvement - Has a knee brace uses occasionally with good relief but gets "hot while wearing this" - Uses topical OTC Bengay at night with relief - No prior surgery or injection - Denies any new injury, fall, redness, swelling  Social History  Substance Use Topics  . Smoking status: Never Smoker  . Smokeless tobacco: Never Used  . Alcohol use Yes    Review of Systems   Per HPI unless specifically indicated  above    Objective:    BP (!) 157/89   Pulse 66   Temp 98.3 F (36.8 C) (Oral)   Resp 16   Ht 5\' 11"  (1.803 m)   Wt 229 lb (103.9 kg)   BMI 31.94 kg/m   Wt Readings from Last 3 Encounters:  01/06/17 229 lb (103.9 kg)  09/07/16 228 lb (103.4 kg)  06/24/16 221 lb (100.2 kg)    Physical Exam  Constitutional: He is oriented to person, place, and time. He appears well-developed and well-nourished. No distress.  Well-appearing, comfortable, cooperative, muscular build  HENT:  Head: Normocephalic and atraumatic.  Mouth/Throat: Oropharynx is clear and moist.  Eyes: Conjunctivae are normal.  Neck: Normal range of motion. Neck supple.  Cardiovascular: Normal rate, regular rhythm, normal heart sounds and intact distal pulses.   No murmur heard. Pulmonary/Chest: Effort normal and breath sounds normal. No respiratory distress. He has no wheezes. He has no rales.  Musculoskeletal: He exhibits no edema or tenderness.  Left Knee Inspection: Normal appearance similar to R knee with resolved soft tissue edema since last visit Palpation: Non-tender joint line bilateral. Mild to moderate crepitus L >R ROM: Full active ROM bilaterally Special Testing: Lachman / Valgus/Varus tests negative with intact ligaments (ACL, MCL, LCL). Standing Thessaly Left knee negative without pain. Strength: 5/5 intact knee flex/ext, ankle dorsi/plantarflex Neurovascular: distally intact sensation light touch and pulses   Neurological: He is alert and oriented to person, place, and time. Coordination normal.  Skin: Skin is warm and dry. He is not diaphoretic.  Psychiatric: He has a normal mood and affect. His behavior is normal.  Nursing note and vitals reviewed.  ________________________________________________________ PROCEDURE NOTE Date: 01/06/17 Left Knee corticosteroid injection Discussed benefits and risks (including pain, bleeding, infection, steroid flare). Verbal consent given by patient. Medication:   1 cc Depo-medrol 40mg  and 4 cc Lidocaine 1% without epi Time Out taken  Landmarks identified. Area cleansed with alcohol wipes.Using 21 gauge and 1, 1/2 inch needle, Left knee joint space was injected (with above listed medication) via lateral approach cold spray used for superficial anesthetic.Sterile bandage placed.Patient tolerated procedure well without bleeding or paresthesias.No complications.  ------------------------------------  I have personally reviewed the radiology report from 02/2016 Left Knee X-ray.  CLINICAL DATA:  Left knee swelling for 1-1/2 months.  No injury.  EXAM: LEFT KNEE - COMPLETE 4+ VIEW  COMPARISON:  Plain film of the left knee dated 11/27/2007.  FINDINGS: There is mild degenerative narrowing of the medial and patellofemoral compartments, with associated mild osseous spurring. Lateral compartment is relatively well preserved.  No acute or suspicious osseous lesion. No fracture line or displaced fracture fragment. Overall bone mineralization is normal. Suspect joint effusion within the suprapatellar bursa. Adjacent soft tissues are unremarkable.  IMPRESSION: 1. Mild degenerative changes involving the medial and patellofemoral compartments, with associated joint space narrowings and mild osseous spurring. 2. Probable joint effusion. 3. No acute-appearing osseous abnormality.   Electronically Signed   By: Franki Cabot M.D.   On: 03/03/2016 13:58  I have personally reviewed the following lab results from 09/07/16.  Results for orders placed or performed in visit on 09/07/16  Comprehensive metabolic panel  Result Value Ref Range   Glucose 113 (H) 65 - 99 mg/dL   BUN 12 6 - 24 mg/dL   Creatinine, Ser 0.80 0.76 - 1.27 mg/dL   GFR calc non Af Amer 98 >59 mL/min/1.73   GFR calc Af Amer 113 >59 mL/min/1.73   BUN/Creatinine Ratio 15 9 - 20   Sodium 142 134 - 144 mmol/L   Potassium 4.0 3.5 - 5.2 mmol/L   Chloride 101 96 - 106 mmol/L   CO2  25 18 - 29 mmol/L   Calcium 9.4 8.7 - 10.2 mg/dL   Total Protein 6.9 6.0 - 8.5 g/dL   Albumin 4.3 3.5 - 5.5 g/dL   Globulin, Total 2.6 1.5 - 4.5 g/dL   Albumin/Globulin Ratio 1.7 1.2 - 2.2   Bilirubin Total 0.4 0.0 - 1.2 mg/dL   Alkaline Phosphatase 48 39 - 117 IU/L   AST 17 0 - 40 IU/L   ALT 22 0 - 44 IU/L  Lipid panel  Result Value Ref Range   Cholesterol, Total 215 (H) 100 - 199 mg/dL   Triglycerides 82 0 - 149 mg/dL   HDL 65 >39 mg/dL   VLDL Cholesterol Cal 16 5 - 40 mg/dL   LDL Calculated 134 (H) 0 - 99 mg/dL   Chol/HDL Ratio 3.3 0.0 - 5.0 ratio units  TSH  Result Value Ref Range   TSH 1.280 0.450 - 4.500 uIU/mL  Aldosterone + renin activity w/ ratio  Result Value Ref Range   ALDOSTERONE 11.6 0.0 - 30.0 ng/dL   Renin 0.239 0.167 - 5.380 ng/mL/hr   ALDOS/RENIN RATIO 48.5 (H) 0.0 - 30.0  CBC with Differential/Platelet  Result Value Ref Range   WBC 4.4 3.4 - 10.8 x10E3/uL   RBC 5.01 4.14 - 5.80 x10E6/uL   Hemoglobin 13.8 13.0 - 17.7 g/dL   Hematocrit 43.7 37.5 - 51.0 %  MCV 87 79 - 97 fL   MCH 27.5 26.6 - 33.0 pg   MCHC 31.6 31.5 - 35.7 g/dL   RDW 13.7 12.3 - 15.4 %   Platelets 452 (H) 150 - 379 x10E3/uL   Neutrophils 39 Not Estab. %   Lymphs 41 Not Estab. %   Monocytes 9 Not Estab. %   Eos 10 Not Estab. %   Basos 1 Not Estab. %   Neutrophils Absolute 1.7 1.4 - 7.0 x10E3/uL   Lymphocytes Absolute 1.8 0.7 - 3.1 x10E3/uL   Monocytes Absolute 0.4 0.1 - 0.9 x10E3/uL   EOS (ABSOLUTE) 0.4 0.0 - 0.4 x10E3/uL   Basophils Absolute 0.1 0.0 - 0.2 x10E3/uL   Immature Granulocytes 0 Not Estab. %   Immature Grans (Abs) 0.0 0.0 - 0.1 x10E3/uL  Iron and TIBC  Result Value Ref Range   Total Iron Binding Capacity 373 250 - 450 ug/dL   UIBC 299 111 - 343 ug/dL   Iron 74 38 - 169 ug/dL   Iron Saturation 20 15 - 55 %  Ferritin  Result Value Ref Range   Ferritin 285 30 - 400 ng/mL  C-reactive protein  Result Value Ref Range   CRP 3.5 0.0 - 4.9 mg/L  Specimen status report    Result Value Ref Range   specimen status report Comment   POCT HgB A1C  Result Value Ref Range   Hemoglobin A1C 6.2       Assessment & Plan:   Problem List Items Addressed This Visit    Resistant hypertension    Stable elevated resistant HTN. Does not have BP log but home checks reportedly 150-160. - Continues poor lifestyle habits (poor diet / no regular exercise) - No known complications, asymptomatic - On ASA 81 mg daily, but not on statin yet, concern with inc risk ASCVD uncontrolled BP - Concern for possible secondary etiology for HTN (still consider OSA), given refractory to 3 medications, seems to be adhering to therapy - Also considered possible hyperaldo or endocrine etiology for secondary HTN, labs from 08/2016 and referral made, still awaiting Endocrine apt  Plan: 1. No change today given acute knee pain affecting BP. Continue Metoprolol XL 25mg  daily, Amlodipine 10, HCTZ 30m, Lisinopril 40 2. Emphasized importance of home BP monitoring, keep hand written log, check daily, bring to next visit 3. Again reviewed lifestyle recommendations adding aerobic exercise, patient to call [place], low sodium diet, wt loss 4. Close follow-up within next 6 weeks for BP follow-up, may titrate up Metoprolol vs switch ACEi/HCTZ to ARB/Chlorthalidone as possibility, and check on status of Endocrine referral for further work-up of secondary cause, vs suspected sleep apnea      Osteoarthritis of left knee    Stable to gradual worsening, chronic left knee pain secondary to multi-compartment OA/DJD (patellofemoral and medial) on last x-ray 02/2016. No new trauma or injury. - Improved with conservative therapy, some inadequate maintenance - No instability or locking concerning for meniscus  Plan: 1. S/p corticosteroid injection today 01/06/17, see procedure note for details - limit activity for 24 hours per handout 2. Start rx Naproxen 500mg  BID for flares only 2-4 weeks PRN 3.  Otherwise again advised regular Tylenol use for daily or mild pains 4. RICE therapy, use compression sleeve can use brace as well if needed 5. Stay active, regular aerobic exercise (low impact, elliptical or pool preferred) 6. Follow-up 6 weeks after injection - future consider PT, Ortho repeat imaging, unlikely ligament or meniscus injury at  this time      Relevant Medications   naproxen (NAPROSYN) 500 MG tablet   methylPREDNISolone acetate (DEPO-MEDROL) injection 40 mg (Completed)   lidocaine (PF) (XYLOCAINE) 1 % injection 4 mL (Completed)   Chronic pain of left knee - Primary    Stable to gradual worsening, chronic left knee pain secondary to multi-compartment OA/DJD (patellofemoral and medial) on last x-ray 02/2016. No new trauma or injury. - Improved with conservative therapy, some inadequate maintenance - No instability or locking concerning for meniscus  Plan: 1. S/p corticosteroid injection today 01/06/17, see procedure note for details - limit activity for 24 hours per handout 2. Start rx Naproxen 500mg  BID for flares only 2-4 weeks PRN 3. Otherwise again advised regular Tylenol use for daily or mild pains 4. RICE therapy, use compression sleeve can use brace as well if needed 5. Stay active, regular aerobic exercise (low impact, elliptical or pool preferred) 6. Follow-up 6 weeks after injection - future consider PT, Ortho repeat imaging, unlikely ligament or meniscus injury at this time      Relevant Medications   naproxen (NAPROSYN) 500 MG tablet   methylPREDNISolone acetate (DEPO-MEDROL) injection 40 mg (Completed)   lidocaine (PF) (XYLOCAINE) 1 % injection 4 mL (Completed)      Meds ordered this encounter  Medications  . DISCONTD: naproxen (NAPROSYN) 500 MG tablet    Sig: Take 500 mg by mouth daily.  . naproxen (NAPROSYN) 500 MG tablet    Sig: Take 1 tablet (500 mg total) by mouth 2 (two) times daily with a meal. For 2-4 weeks then as needed    Dispense:  60 tablet     Refill:  2  . methylPREDNISolone acetate (DEPO-MEDROL) injection 40 mg  . lidocaine (PF) (XYLOCAINE) 1 % injection 4 mL      Follow up plan: Return in about 6 weeks (around 02/17/2017) for HTN, Left Knee Pain.  [name], [facility] 01/06/2017, 12:57 PM

## 2017-01-06 NOTE — Assessment & Plan Note (Signed)
Stable elevated resistant HTN. Does not have BP log but home checks reportedly 150-160. - Continues poor lifestyle habits (poor diet / no regular exercise) - No known complications, asymptomatic - On ASA 81 mg daily, but not on statin yet, concern with inc risk ASCVD uncontrolled BP - Concern for possible secondary etiology for HTN (still consider OSA), given refractory to 3 medications, seems to be adhering to therapy - Also considered possible hyperaldo or endocrine etiology for secondary HTN, labs from 08/2016 and referral made, still awaiting Endocrine apt  Plan: 1. No change today given acute knee pain affecting BP. Continue Metoprolol XL 25mg  daily, Amlodipine 10, HCTZ 37m, Lisinopril 40 2. Emphasized importance of home BP monitoring, keep hand written log, check daily, bring to next visit 3. Again reviewed lifestyle recommendations adding aerobic exercise, patient to call [place], low sodium diet, wt loss 4. Close follow-up within next 6 weeks for BP follow-up, may titrate up Metoprolol vs switch ACEi/HCTZ to ARB/Chlorthalidone as possibility, and check on status of Endocrine referral for further work-up of secondary cause, vs suspected sleep apnea

## 2017-01-06 NOTE — Patient Instructions (Signed)
Thank you for coming in to clinic today.  1. You received a Left Knee Joint steroid injection today. - Lidocaine numbing medicine may ease the pain initially for a few hours until it wears off - As discussed, you may experience a "steroid flare" this evening or within 24-48 hours, anytime medicine is injected into an inflamed joint it can cause the pain to get worse temporarily - Everyone responds differently to these injections, it depends on the patient and the severity of the joint problem, it may provide anywhere from days to weeks, to months of relief. Ideal response is >6 months relief - Try to take it easy for next 1-2 days, avoid over activity and strain on joint (limit walking for knee) - Recommend the following:   - For swelling - rest, compression sleeve / ACE wrap, elevation, and ice packs as needed for first few days   - For pain in future may use heating pad or moist heat as needed  Recommend trial of Anti-inflammatory with Naproxen (Naprosyn) 500mg  tabs - take one with food and plenty of water TWICE daily every day (breakfast and dinner), for next 2 to 4 weeks, then you may take only as needed - DO NOT TAKE any ibuprofen, aleve, motrin while you are taking this medicine - It is safe to take Tylenol Ext Str 500mg  tabs - take 1 to 2 (max dose 1000mg ) every 6 hours as needed for breakthrough pain, max 24 hour daily dose is 6 to 8 tablets or 4000mg   Recommend to continue topical bengay at night, continue ice packs as needed, continue to use knee brace or sleeve  Use RICE therapy: - R - Rest / relative rest with activity modification avoid overuse of joint - I - Ice packs (make sure you use a towel or sock / something to protect skin) - C - Compression with flexible Knee Sleeve ACE wrap to apply pressure and reduce swelling allowing more support - E - Elevation - if significant swelling, lift leg above heart level (toes above your nose) to help reduce swelling, most helpful at night  after day of being on your feet  I dont think you have a torn ligament in knee. Less likely to be tendonitis as well.  Reduce sodium or salty foods, try to stay active exercise, keep checking BP few times a week, write down and bring in log to next visit.  May make medication change  Please schedule a follow-up appointment with Dr. Parks Ranger in 6 weeks to follow-up Left Knee Pain, HTN  If you have any other questions or concerns, please feel free to call the clinic or send a message through [place]. You may also schedule an earlier appointment if necessary.  [name], DO [place]

## 2017-01-08 ENCOUNTER — Ambulatory Visit: Payer: 59 | Admitting: Nurse Practitioner

## 2017-02-03 ENCOUNTER — Other Ambulatory Visit: Payer: Self-pay

## 2017-02-03 DIAGNOSIS — I10 Essential (primary) hypertension: Secondary | ICD-10-CM

## 2017-02-03 MED ORDER — AMLODIPINE BESYLATE 10 MG PO TABS: ORAL_TABLET | ORAL | 3 refills | Status: DC

## 2017-02-03 MED ORDER — LISINOPRIL 40 MG PO TABS: 40.0000 mg | ORAL_TABLET | Freq: Every day | ORAL | 3 refills | Status: DC

## 2017-02-18 ENCOUNTER — Ambulatory Visit: Payer: 59 | Admitting: Family Medicine

## 2017-05-17 ENCOUNTER — Ambulatory Visit (INDEPENDENT_AMBULATORY_CARE_PROVIDER_SITE_OTHER): Payer: 59 | Admitting: Family Medicine

## 2017-05-17 ENCOUNTER — Encounter: Payer: Self-pay | Admitting: Family Medicine

## 2017-05-17 ENCOUNTER — Encounter: Payer: 59 | Admitting: Family Medicine

## 2017-05-17 VITALS — BP 154/90 | HR 80 | Temp 98.4°F | Resp 16 | Ht 71.0 in | Wt 228.0 lb

## 2017-05-17 DIAGNOSIS — D75839 Thrombocytosis, unspecified: Secondary | ICD-10-CM

## 2017-05-17 DIAGNOSIS — E669 Obesity, unspecified: Secondary | ICD-10-CM | POA: Diagnosis not present

## 2017-05-17 DIAGNOSIS — D473 Essential (hemorrhagic) thrombocythemia: Secondary | ICD-10-CM | POA: Diagnosis not present

## 2017-05-17 DIAGNOSIS — R7303 Prediabetes: Secondary | ICD-10-CM | POA: Diagnosis not present

## 2017-05-17 DIAGNOSIS — M25562 Pain in left knee: Secondary | ICD-10-CM

## 2017-05-17 DIAGNOSIS — I10 Essential (primary) hypertension: Secondary | ICD-10-CM

## 2017-05-17 DIAGNOSIS — E782 Mixed hyperlipidemia: Secondary | ICD-10-CM | POA: Diagnosis not present

## 2017-05-17 DIAGNOSIS — G8929 Other chronic pain: Secondary | ICD-10-CM

## 2017-05-17 DIAGNOSIS — M1712 Unilateral primary osteoarthritis, left knee: Secondary | ICD-10-CM | POA: Diagnosis not present

## 2017-05-17 DIAGNOSIS — M62838 Other muscle spasm: Secondary | ICD-10-CM | POA: Diagnosis not present

## 2017-05-17 DIAGNOSIS — I1A0 Resistant hypertension: Secondary | ICD-10-CM

## 2017-05-17 DIAGNOSIS — E66811 Obesity, class 1: Secondary | ICD-10-CM

## 2017-05-17 MED ORDER — NAPROXEN 500 MG PO TABS
500.0000 mg | ORAL_TABLET | Freq: Two times a day (BID) | ORAL | 2 refills | Status: DC
Start: 2017-05-17 — End: 2017-06-17

## 2017-05-17 MED ORDER — METOPROLOL SUCCINATE ER 25 MG PO TB24: 25.0000 mg | ORAL_TABLET | Freq: Every day | ORAL | 3 refills | Status: DC

## 2017-05-17 MED ORDER — BACLOFEN 10 MG PO TABS: 5.0000 mg | ORAL_TABLET | Freq: Every day | ORAL | 0 refills | Status: DC

## 2017-05-17 MED ORDER — METFORMIN HCL 500 MG PO TABS: 500.0000 mg | ORAL_TABLET | Freq: Every day | ORAL | 3 refills | Status: DC

## 2017-05-17 MED ORDER — LISINOPRIL 40 MG PO TABS: 40.0000 mg | ORAL_TABLET | Freq: Every day | ORAL | 0 refills | Status: DC

## 2017-05-17 MED ORDER — METFORMIN HCL 500 MG PO TABS: 500.0000 mg | ORAL_TABLET | Freq: Every day | ORAL | 0 refills | Status: DC

## 2017-05-17 MED ORDER — METHYLPREDNISOLONE ACETATE 40 MG/ML IJ SUSP
40.0000 mg | Freq: Once | INTRAMUSCULAR | Status: AC
  Administered 2017-05-17: 40 mg via INTRA_ARTICULAR

## 2017-05-17 MED ORDER — LISINOPRIL 40 MG PO TABS: 40.0000 mg | ORAL_TABLET | Freq: Every day | ORAL | 3 refills | Status: DC

## 2017-05-17 MED ORDER — METOPROLOL SUCCINATE ER 25 MG PO TB24: 25.0000 mg | ORAL_TABLET | Freq: Every day | ORAL | 0 refills | Status: DC

## 2017-05-17 MED ORDER — LIDOCAINE HCL (PF) 1 % IJ SOLN
4.0000 mL | Freq: Once | INTRAMUSCULAR | Status: AC
  Administered 2017-05-17: 4 mL

## 2017-05-17 NOTE — Patient Instructions (Addendum)
Thank you for coming to the clinic today.  1. You received a Left Knee Joint steroid injection today. - Lidocaine numbing medicine may ease the pain initially for a few hours until it wears off - As discussed, you may experience a "steroid flare" this evening or within 24-48 hours, anytime medicine is injected into an inflamed joint it can cause the pain to get worse temporarily - Everyone responds differently to these injections, it depends on the patient and the severity of the joint problem, it may provide anywhere from days to weeks, to months of relief. Ideal response is >6 months relief - Try to take it easy for next 1-2 days, avoid over activity and strain on joint (limit walking for knee) - Recommend the following:   - For swelling - rest, compression sleeve / ACE wrap, elevation, and ice packs as needed for first few days   - For pain in future may use heating pad or moist heat as needed  Plan to refer you to Orthopedics in future if not improving.  Recommend to start taking Tylenol Extra Strength 500mg  tabs - take 1 to 2 tabs per dose (max 1000mg ) every 6-8 hours for pain (take regularly, don't skip a dose for next 7 days), max 24 hour daily dose is 6 tablets or 3000mg . In the future you can repeat the same everyday Tylenol course for 1-2 weeks at a time.  - This is safe to take with anti-inflammatory medicines (Ibuprofen, Advil, Naproxen, Aleve, Meloxicam, Mobic)  Refilled Naproxen only as needed for joint pain.  Start taking Baclofen (Lioresal) 10mg  (muscle relaxant) - start with one pill at night as needed for next 1-3 nights (may make you drowsy, caution with driving) see how it affects you, then if tolerated increase to one pill 2 to 3 times a day or (every 8 hours as needed)  Prefer ONLY bedtime for Baclofen   2. For Resistant HTN  Referral previously placed in 08/2016 for Endocrinology due to uncontrolled Hypertension. You were unable to schedule this apt.  [facility] [address] [place], [place]  [address] Phone: [facility] [address] [address] [place], [place]  [address] Phone: [phone]  A. Lavone Orn, MD ([place]) Adella Hare, MD ([place]) Stacie Glaze, MD, [facility] ([place])  Please schedule a Follow-up Appointment to: Return in about 2 days (around 05/19/2017) for Annual Physical.  If you have any other questions or concerns, please feel free to call the clinic or send a message through [place]. You may also schedule an earlier appointment if necessary.  Additionally, you may be receiving a survey about your experience at our clinic within a few days to 1 week by e-mail or mail. We value your feedback.  [name], DO [place]

## 2017-05-17 NOTE — Progress Notes (Signed)
Subjective:    Patient ID: Darrell Abu Sr., male    DOB: 04/12/1956, 61 y.o.   MRN: [id number]  Darrell Eastham Maggart Sr. is a 61 y.o. male presenting on 05/17/2017 for Knee Pain (left side may need another cortisone shot onset 2 weeks) and Neck Pain (back side onset 2 days)   HPI   FOLLOW-UP Chronic Left Knee Pain with OA/DJD: - Last visit with me 01/06/17, for follow-up for same problem, treated with L knee steroid injection at that time, Naproxen, Tylenol, RICE, see prior notes for background information. - Interval update with significant improvement on steroid injection for about 3 months. - Today patient reports recurrence of L knee pain, laterally, interested in repeat injection today. He did well with last one. He works for [employer], in and out of car affecting knee - Has not done PT, eval by Ortho or any repeat imaging, last 2017 - Was not taking Tylenol, only occasional Naproxen or Aleve usually in AM - Has a knee brace uses occasionally - Denies any new injury, fall, redness, swelling  CHRONIC HTN: - Last visit with me 01/06/17, for follow-up same problem, no med changes, see prior notes for background information. - Interval update with patient was referred to [facility] for possible secondary HTN, however he never re-scheduled in 08/2016, does not provide clear reason, difficulty with limited time off from work - Today patient reports still has problems adhering to lifestyle, eats salty foods occasionally - Home BP readings often elevated Current Meds - Lisinopril 40mg , HCTZ 25mg , Amlodpine 10mg  Reports good compliance, took meds today. Tolerating well, w/o complaints. - Caffeine: Drinks half cup coffee everyday, no tea or soda Denies CP, dyspnea, HA, edema, dizziness / lightheadedness  PMH - Pre-Diabetes, never diagnosed with DM  Social History  Substance Use Topics  . Smoking status: Never Smoker  . Smokeless tobacco: Never Used  . Alcohol use Yes     Review of Systems  Musculoskeletal: Positive for neck pain.   Per HPI unless specifically indicated above    Objective:    BP (!) 154/90 (BP Location: Left Arm, Cuff Size: Normal)   Pulse 80   Temp 98.4 F (36.9 C) (Oral)   Resp 16   Ht 5\' 11"  (1.803 m)   Wt 228 lb (103.4 kg)   BMI 31.80 kg/m   Wt Readings from Last 3 Encounters:  05/17/17 228 lb (103.4 kg)  01/06/17 229 lb (103.9 kg)  09/07/16 228 lb (103.4 kg)    Physical Exam  Constitutional: He is oriented to person, place, and time. He appears well-developed and well-nourished. No distress.  Well-appearing, comfortable, cooperative, muscular build  HENT:  Head: Normocephalic and atraumatic.  Mouth/Throat: Oropharynx is clear and moist.  Eyes: Conjunctivae are normal.  Neck: Normal range of motion. Neck supple.  Cardiovascular: Normal rate, regular rhythm, normal heart sounds and intact distal pulses.   No murmur heard. Pulmonary/Chest: Effort normal and breath sounds normal. No respiratory distress. He has no wheezes. He has no rales.  Musculoskeletal: He exhibits no edema or tenderness.  Left Knee - similar to last exam 12/2016 Inspection: Slightly inc mild soft tissue edema around L knee, otherwise similar appearance similar to R knee Palpation: Mild tender L lateral joint line. Mild to moderate crepitus L >R ROM: Full active ROM bilaterally Special Testing: Lachman / Valgus/Varus tests negative with intact ligaments (ACL, MCL, LCL). Standing Thessaly Left knee not repeated, was negative last time Strength: 5/5 intact knee flex/ext,  ankle dorsi/plantarflex Neurovascular: distally intact sensation light touch and pulses   Neurological: He is alert and oriented to person, place, and time. Coordination normal.  Skin: Skin is warm and dry. He is not diaphoretic.  Psychiatric: He has a normal mood and affect. His behavior is normal.  Nursing note and vitals  reviewed.  ________________________________________________________ PROCEDURE NOTE Date: 05/17/17 Left Knee corticosteroid injection Discussed benefits and risks (including pain, bleeding, infection, steroid flare). Verbal consent given by patient. Medication:  1 cc Depo-medrol 40mg  and 4 cc Lidocaine 1% without epi Time Out taken  Landmarks identified. Area cleansed with alcohol wipes.Using 21 gauge and 1, 1/2 inch needle, Left knee joint space was injected (with above listed medication) via lateral approach cold spray used for superficial anesthetic.Sterile bandage placed.Patient tolerated procedure well without bleeding or paresthesias.No complications.   Results for orders placed or performed in visit on 09/07/16  Comprehensive metabolic panel  Result Value Ref Range   Glucose 113 (H) 65 - 99 mg/dL   BUN 12 6 - 24 mg/dL   Creatinine, Ser 0.80 0.76 - 1.27 mg/dL   GFR calc non Af Amer 98 >59 mL/min/1.73   GFR calc Af Amer 113 >59 mL/min/1.73   BUN/Creatinine Ratio 15 9 - 20   Sodium 142 134 - 144 mmol/L   Potassium 4.0 3.5 - 5.2 mmol/L   Chloride 101 96 - 106 mmol/L   CO2 25 18 - 29 mmol/L   Calcium 9.4 8.7 - 10.2 mg/dL   Total Protein 6.9 6.0 - 8.5 g/dL   Albumin 4.3 3.5 - 5.5 g/dL   Globulin, Total 2.6 1.5 - 4.5 g/dL   Albumin/Globulin Ratio 1.7 1.2 - 2.2   Bilirubin Total 0.4 0.0 - 1.2 mg/dL   Alkaline Phosphatase 48 39 - 117 IU/L   AST 17 0 - 40 IU/L   ALT 22 0 - 44 IU/L  Lipid panel  Result Value Ref Range   Cholesterol, Total 215 (H) 100 - 199 mg/dL   Triglycerides 82 0 - 149 mg/dL   HDL 65 >39 mg/dL   VLDL Cholesterol Cal 16 5 - 40 mg/dL   LDL Calculated 134 (H) 0 - 99 mg/dL   Chol/HDL Ratio 3.3 0.0 - 5.0 ratio units  TSH  Result Value Ref Range   TSH 1.280 0.450 - 4.500 uIU/mL  Aldosterone + renin activity w/ ratio  Result Value Ref Range   ALDOSTERONE 11.6 0.0 - 30.0 ng/dL   Renin 0.239 0.167 - 5.380 ng/mL/hr   ALDOS/RENIN RATIO 48.5 (H) 0.0 - 30.0   CBC with Differential/Platelet  Result Value Ref Range   WBC 4.4 3.4 - 10.8 x10E3/uL   RBC 5.01 4.14 - 5.80 x10E6/uL   Hemoglobin 13.8 13.0 - 17.7 g/dL   Hematocrit 43.7 37.5 - 51.0 %   MCV 87 79 - 97 fL   MCH 27.5 26.6 - 33.0 pg   MCHC 31.6 31.5 - 35.7 g/dL   RDW 13.7 12.3 - 15.4 %   Platelets 452 (H) 150 - 379 x10E3/uL   Neutrophils 39 Not Estab. %   Lymphs 41 Not Estab. %   Monocytes 9 Not Estab. %   Eos 10 Not Estab. %   Basos 1 Not Estab. %   Neutrophils Absolute 1.7 1.4 - 7.0 x10E3/uL   Lymphocytes Absolute 1.8 0.7 - 3.1 x10E3/uL   Monocytes Absolute 0.4 0.1 - 0.9 x10E3/uL   EOS (ABSOLUTE) 0.4 0.0 - 0.4 x10E3/uL   Basophils Absolute 0.1 0.0 - 0.2 x10E3/uL  Immature Granulocytes 0 Not Estab. %   Immature Grans (Abs) 0.0 0.0 - 0.1 x10E3/uL  Iron and TIBC  Result Value Ref Range   Total Iron Binding Capacity 373 250 - 450 ug/dL   UIBC 299 111 - 343 ug/dL   Iron 74 38 - 169 ug/dL   Iron Saturation 20 15 - 55 %  Ferritin  Result Value Ref Range   Ferritin 285 30 - 400 ng/mL  C-reactive protein  Result Value Ref Range   CRP 3.5 0.0 - 4.9 mg/L  Specimen status report  Result Value Ref Range   specimen status report Comment   POCT HgB A1C  Result Value Ref Range   Hemoglobin A1C 6.2       Assessment & Plan:   Problem List Items Addressed This Visit    Thrombocytosis ([place])   Relevant Orders   CBC with Differential/Platelet   Resistant hypertension - Primary    Stable elevated resistant HTN. Out of 2 BP medications today, also L knee pain. - Does not have BP log but home checks reportedly still elevated. - Still poor lifestyle habits (poor diet / no regular exercise) - No known complications, asymptomatic - Concern for possible secondary etiology for HTN (still consider OSA), given refractory to 3 medications, seems to be adhering to therapy - Also considered possible hyperaldo or endocrine etiology for secondary HTN, labs from 08/2016, he never re-scheduled apt,  but acknowledges this now  Plan: 1. Continue Metoprolol XL 25mg  daily, Amlodipine 10, HCTZ 25mg , Lisinopril 40 - refilled meds today 2. Emphasized importance of home BP monitoring, keep hand written log, check daily, bring to next visit 3. Again reviewed lifestyle recommendations adding aerobic exercise (difference between walking short distances during work out of car, and a sustained regular brisk walk for goal of exercise few times week), low sodium diet 4. He will contact [facility] End[facility] again to review abnormal labs and discuss secondary HTN 5. Future consider switch ACEi/HCTZ to ARB/Chlorthalidone as possibility 6. Next step in office will be to pursue OSA sleep study may be other 2ndary cause 7. Follow-up this week after labs for annual phys      Relevant Medications   lisinopril (PRINIVIL,ZESTRIL) 40 MG tablet   metoprolol succinate (TOPROL-XL) 25 MG 24 hr tablet   Other Relevant Orders   Comprehensive metabolic panel   Pre-diabetes   Relevant Medications   metFORMIN (GLUCOPHAGE) 500 MG tablet   Other Relevant Orders   Hemoglobin A1c   Osteoarthritis of left knee    Stable now with recurrent flare, after 3 months injection had improved, chronic left knee pain secondary to multi-compartment OA/DJD - Last X-ray 02/2016 - No instability or locking concerning for meniscus  Plan: 1. S/p corticosteroid injection today 05/18/17 (2nd injection in 4 months), see procedure note for details - limit activity for 24 hours per handout 2. May continue NSAID Naproxen / Tylenol PRN 3. RICE therapy, use compression sleeve can use brace as well if needed 4. Stay active, regular aerobic exercise (low impact, elliptical or pool preferred) 5. Follow-up 6 weeks after injection - future consider PT, Ortho repeat imaging - if requires future 3rd injection will need to establish with Orthopedics      Relevant Medications   naproxen (NAPROSYN) 500 MG tablet   baclofen (LIORESAL) 10 MG tablet    methylPREDNISolone acetate (DEPO-MEDROL) injection 40 mg (Completed)   lidocaine (PF) (XYLOCAINE) 1 % injection 4 mL (Completed)   Obesity (BMI 30.0-34.9)   Relevant Medications  metFORMIN (GLUCOPHAGE) 500 MG tablet   Hyperlipidemia   Relevant Medications   lisinopril (PRINIVIL,ZESTRIL) 40 MG tablet   metoprolol succinate (TOPROL-XL) 25 MG 24 hr tablet   Other Relevant Orders   Lipid panel   Chronic pain of left knee   Relevant Medications   naproxen (NAPROSYN) 500 MG tablet   baclofen (LIORESAL) 10 MG tablet   methylPREDNISolone acetate (DEPO-MEDROL) injection 40 mg (Completed)   lidocaine (PF) (XYLOCAINE) 1 % injection 4 mL (Completed)    Other Visit Diagnoses    Neck muscle spasm       Relevant Medications   baclofen (LIORESAL) 10 MG tablet      Meds ordered this encounter  Medications  . DISCONTD: indomethacin (INDOCIN) 50 MG capsule    Sig: indomethacin 50 mg capsule  . DISCONTD: Naproxen (NAPROSYN PO)    Sig: naproxen  . DISCONTD: metoprolol succinate (TOPROL-XL) 25 MG 24 hr tablet    Sig: Take 1 tablet (25 mg total) by mouth daily.    Dispense:  30 tablet    Refill:  0  . DISCONTD: metFORMIN (GLUCOPHAGE) 500 MG tablet    Sig: Take 1 tablet (500 mg total) by mouth daily with breakfast.    Dispense:  30 tablet    Refill:  0  . DISCONTD: lisinopril (PRINIVIL,ZESTRIL) 40 MG tablet    Sig: Take 1 tablet (40 mg total) by mouth daily.    Dispense:  30 tablet    Refill:  0  . lisinopril (PRINIVIL,ZESTRIL) 40 MG tablet    Sig: Take 1 tablet (40 mg total) by mouth daily.    Dispense:  90 tablet    Refill:  3  . metFORMIN (GLUCOPHAGE) 500 MG tablet    Sig: Take 1 tablet (500 mg total) by mouth daily with breakfast.    Dispense:  90 tablet    Refill:  3  . metoprolol succinate (TOPROL-XL) 25 MG 24 hr tablet    Sig: Take 1 tablet (25 mg total) by mouth daily.    Dispense:  90 tablet    Refill:  3  . naproxen (NAPROSYN) 500 MG tablet    Sig: Take 1 tablet (500 mg  total) by mouth 2 (two) times daily with a meal. For 2-4 weeks then as needed    Dispense:  60 tablet    Refill:  2  . baclofen (LIORESAL) 10 MG tablet    Sig: Take 0.5-1 tablets (5-10 mg total) by mouth at bedtime.    Dispense:  30 each    Refill:  0  . methylPREDNISolone acetate (DEPO-MEDROL) injection 40 mg  . lidocaine (PF) (XYLOCAINE) 1 % injection 4 mL    Follow up plan: Return in about 2 days (around 05/19/2017) for Annual Physical.  Labs printed ordered for [facility].  Completed work biometric [facility] BMI. Returned to patient today.  [name], [facility] 05/17/2017, 1:29 PM

## 2017-05-18 NOTE — Assessment & Plan Note (Addendum)
Stable elevated resistant HTN. Out of 2 BP medications today, also L knee pain. - Does not have BP log but home checks reportedly still elevated. - Still poor lifestyle habits (poor diet / no regular exercise) - No known complications, asymptomatic - Concern for possible secondary etiology for HTN (still consider OSA), given refractory to 3 medications, seems to be adhering to therapy - Also considered possible hyperaldo or endocrine etiology for secondary HTN, labs from 08/2016, he never re-scheduled apt, but acknowledges this now  Plan: 1. Continue Metoprolol XL 25mg  daily, Amlodipine 10, HCTZ 25mg , Lisinopril 40 - refilled meds today 2. Emphasized importance of home BP monitoring, keep hand written log, check daily, bring to next visit 3. Again reviewed lifestyle recommendations adding aerobic exercise (difference between walking short distances during work out of car, and a sustained regular brisk walk for goal of exercise few times week), low sodium diet 4. He will contact [facility]ology again to review abnormal labs and discuss secondary HTN 5. Future consider switch ACEi/HCTZ to ARB/Chlorthalidone as possibility 6. Next step in office will be to pursue OSA sleep study may be other 2ndary cause 7. Follow-up this week after labs for annual phys

## 2017-05-18 NOTE — Assessment & Plan Note (Signed)
Stable now with recurrent flare, after 3 months injection had improved, chronic left knee pain secondary to multi-compartment OA/DJD - Last X-ray 02/2016 - No instability or locking concerning for meniscus  Plan: 1. S/p corticosteroid injection today 05/18/17 (2nd injection in 4 months), see procedure note for details - limit activity for 24 hours per handout 2. May continue NSAID Naproxen / Tylenol PRN 3. RICE therapy, use compression sleeve can use brace as well if needed 4. Stay active, regular aerobic exercise (low impact, elliptical or pool preferred) 5. Follow-up 6 weeks after injection - future consider PT, Ortho repeat imaging - if requires future 3rd injection will need to establish with Orthopedics

## 2017-05-19 ENCOUNTER — Ambulatory Visit (INDEPENDENT_AMBULATORY_CARE_PROVIDER_SITE_OTHER): Payer: 59 | Admitting: Family Medicine

## 2017-05-19 ENCOUNTER — Encounter: Payer: 59 | Admitting: Family Medicine

## 2017-05-19 ENCOUNTER — Encounter: Payer: Self-pay | Admitting: Family Medicine

## 2017-05-19 VITALS — BP 148/82 | HR 60 | Temp 98.4°F | Resp 16 | Ht 71.0 in | Wt 230.0 lb

## 2017-05-19 DIAGNOSIS — R29818 Other symptoms and signs involving the nervous system: Secondary | ICD-10-CM

## 2017-05-19 DIAGNOSIS — M62838 Other muscle spasm: Secondary | ICD-10-CM | POA: Diagnosis not present

## 2017-05-19 DIAGNOSIS — D473 Essential (hemorrhagic) thrombocythemia: Secondary | ICD-10-CM | POA: Diagnosis not present

## 2017-05-19 DIAGNOSIS — Z0000 Encounter for general adult medical examination without abnormal findings: Secondary | ICD-10-CM

## 2017-05-19 DIAGNOSIS — E782 Mixed hyperlipidemia: Secondary | ICD-10-CM | POA: Diagnosis not present

## 2017-05-19 DIAGNOSIS — R7303 Prediabetes: Secondary | ICD-10-CM | POA: Diagnosis not present

## 2017-05-19 DIAGNOSIS — E669 Obesity, unspecified: Secondary | ICD-10-CM

## 2017-05-19 DIAGNOSIS — I10 Essential (primary) hypertension: Secondary | ICD-10-CM | POA: Diagnosis not present

## 2017-05-19 DIAGNOSIS — D75839 Thrombocytosis, unspecified: Secondary | ICD-10-CM

## 2017-05-19 LAB — COMPREHENSIVE METABOLIC PANEL
A/G RATIO: 1.5 (ref 1.2–2.2)
ALBUMIN: 4.6 g/dL (ref 3.5–5.5)
ALT: 26 IU/L (ref 0–44)
AST: 23 IU/L (ref 0–40)
Alkaline Phosphatase: 52 IU/L (ref 39–117)
BUN / CREAT RATIO: 15 (ref 9–20)
BUN: 13 mg/dL (ref 6–24)
Bilirubin Total: 0.4 mg/dL (ref 0.0–1.2)
CO2: 25 mmol/L (ref 20–29)
CREATININE: 0.85 mg/dL (ref 0.76–1.27)
Calcium: 9.7 mg/dL (ref 8.7–10.2)
Chloride: 98 mmol/L (ref 96–106)
GFR calc Af Amer: 110 mL/min/{1.73_m2} (ref >59)
GFR calc non Af Amer: 95 mL/min/{1.73_m2} (ref >59)
GLOBULIN, TOTAL: 3.1 g/dL (ref 1.5–4.5)
Glucose: 112 mg/dL — ABNORMAL HIGH (ref 65–99)
POTASSIUM: 4.3 mmol/L (ref 3.5–5.2)
SODIUM: 142 mmol/L (ref 134–144)
Total Protein: 7.7 g/dL (ref 6.0–8.5)

## 2017-05-19 LAB — CBC WITH DIFFERENTIAL/PLATELET
BASOS ABS: 0 10*3/uL (ref 0.0–0.2)
Basos: 0 %
EOS (ABSOLUTE): 0 10*3/uL (ref 0.0–0.4)
EOS: 0 %
Hematocrit: 43.5 % (ref 37.5–51.0)
Hemoglobin: 14.4 g/dL (ref 13.0–17.7)
IMMATURE GRANULOCYTES: 0 %
Immature Grans (Abs): 0 10*3/uL (ref 0.0–0.1)
LYMPHS ABS: 2 10*3/uL (ref 0.7–3.1)
Lymphs: 25 %
MCH: 28.5 pg (ref 26.6–33.0)
MCHC: 33.1 g/dL (ref 31.5–35.7)
MCV: 86 fL (ref 79–97)
MONOCYTES: 9 %
MONOS ABS: 0.7 10*3/uL (ref 0.1–0.9)
NEUTROS PCT: 66 %
Neutrophils Absolute: 5.4 10*3/uL (ref 1.4–7.0)
Platelets: 453 10*3/uL — ABNORMAL HIGH (ref 150–379)
RBC: 5.05 x10E6/uL (ref 4.14–5.80)
RDW: 14.2 % (ref 12.3–15.4)
WBC: 8.2 10*3/uL (ref 3.4–10.8)

## 2017-05-19 LAB — LIPID PANEL
CHOL/HDL RATIO: 2.8 ratio (ref 0.0–5.0)
CHOLESTEROL TOTAL: 230 mg/dL — AB (ref 100–199)
HDL: 82 mg/dL (ref >39)
LDL CALC: 124 mg/dL — AB (ref 0–99)
Triglycerides: 120 mg/dL (ref 0–149)
VLDL Cholesterol Cal: 24 mg/dL (ref 5–40)

## 2017-05-19 LAB — HEMOGLOBIN A1C
ESTIMATED AVERAGE GLUCOSE: 126 mg/dL
Hgb A1c MFr Bld: 6 % — ABNORMAL HIGH (ref 4.8–5.6)

## 2017-05-19 NOTE — Progress Notes (Signed)
Subjective:    Patient ID: Darrell Abu Sr., male    DOB: Aug 19, 1956, 61 y.o.   MRN: [id number]  Darrell Sinning Kassin Sr. is a 61 y.o. male presenting on 05/19/2017 for Annual Exam   HPI   Here for Annual Exam and Lab Review.  CHRONIC HTN: - Last visit with me 05/17/17, for follow-up same problem, no med changes, see prior notes for background information. - Interval update with patient was referred to [facility] End[facility]ology for possible secondary HTN, however he never re-scheduled in 08/2016, does not provide clear reason, difficulty with limited time off from work - Today patient reports will try to improve adhering to lifestyle, eats salty foods occasionally - Home BP readings often elevated Current Meds - Lisinopril 40mg , HCTZ 25mg , Amlodpine 10mg , Metoprolol XL 25mg  daily - Did not take all BP meds today, recently ordered Reports good compliance, took meds today. Tolerating well, w/o complaints. - Caffeine: Drinks half cup coffee everyday, no tea or soda Denies CP, dyspnea, HA, edema, dizziness / lightheadedness  Pre-Diabetes Improved A1c control.  L Knee Pain, Chronic - Reports improved s/p recent steroid injection on 8/27  Thrombocytopenia - He is unaware of this problem, and does not recall previous lab result from 08/2016 with similar reading. He was not interested in Hematology referral at that time  HYPERLIPIDEMIA: - Reports no concerns. Last lipid panel 04/2017, uncontrolled  - Not on cholesterol med or ASA  SUSPECTED SLEEP APNEA: - Previous discussion on concern for OSA due to elevated BP, and concern secondary cause, also with several risk factors, see screening below. He has never had sleep study. - Denies witnessed apnea  Epworth Sleepiness Scale Total Score: 12 (high risk) Sitting and reading - 1 Watching TV - 3 Sitting inactive in a public place - 1 As a passenger in a car for an hour without a break - 3 Lying down to rest in the afternoon when circumstances  permit - 3 Sitting and talking to someone - 0 Sitting quietly after a lunch without alcohol - 0 In a car, while stopped for a few minutes in traffic - 2  STOP-Bang OSA scoring Snoring yes   Tiredness yes   Observed apneas no   Pressure HTN yes   BMI > 35 kg/m2 no   Age > 19  yes   Neck (male >17 in; Male >16 in)  yes 9.5"  Gender male yes   OSA risk low (0-2)  OSA risk intermediate (3-4)  OSA risk high (5+)  Total: 6 (High Risk)    Past Medical History:  Diagnosis Date  . Allergy   . Gout   . Hypertension   . Obesity   . Thrombocytosis ([place])    No past surgical history on file. Social History   Social History  . Marital status: Married    Spouse name: N/A  . Number of children: N/A  . Years of education: N/A   Occupational History  . Not on file.   Social History Main Topics  . Smoking status: Never Smoker  . Smokeless tobacco: Never Used  . Alcohol use Yes  . Drug use: No  . Sexual activity: Not on file   Other Topics Concern  . Not on file   Social History Narrative  . No narrative on file   Family History  Problem Relation Age of Onset  . Diabetes Mother    Current Outpatient Prescriptions on File Prior to Visit  Medication Sig  . amLODipine (  NORVASC) 10 MG tablet Take 1 tablet daily for BP  . aspirin EC 81 MG tablet Take 1 tablet (81 mg total) by mouth daily.  . baclofen (LIORESAL) 10 MG tablet Take 0.5-1 tablets (5-10 mg total) by mouth at bedtime.  . colchicine 0.6 MG tablet Take 1 tablet (0.6 mg total) by mouth daily. (Patient taking differently: Take 0.6 mg by mouth daily as needed. )  . Elastic Bandages & Supports (THUMB SPLINT/RIGHT LARGE) MISC 1 each by Does not apply route daily.  . hydrochlorothiazide (HYDRODIURIL) 25 MG tablet Take 1 tablet (25 mg total) by mouth daily.  Marland Kitchen lisinopril (PRINIVIL,ZESTRIL) 40 MG tablet Take 1 tablet (40 mg total) by mouth daily.  . metFORMIN (GLUCOPHAGE) 500 MG tablet Take 1 tablet (500 mg total) by mouth  daily with breakfast.  . metoprolol succinate (TOPROL-XL) 25 MG 24 hr tablet Take 1 tablet (25 mg total) by mouth daily.  . naproxen (NAPROSYN) 500 MG tablet Take 1 tablet (500 mg total) by mouth 2 (two) times daily with a meal. For 2-4 weeks then as needed  . ONE TOUCH ULTRA TEST test strip   . ONETOUCH DELICA LANCETS 32K MISC   . terbinafine (LAMISIL AT) 1 % cream Apply 1 application topically 2 (two) times daily.   No current facility-administered medications on file prior to visit.     Review of Systems  Constitutional: Positive for fatigue. Negative for activity change, appetite change, chills, diaphoresis, fever and unexpected weight change.  HENT: Negative for congestion, hearing loss and sinus pressure.   Eyes: Negative for visual disturbance.  Respiratory: Positive for apnea. Negative for cough, chest tightness, shortness of breath and wheezing.   Cardiovascular: Negative for chest pain, palpitations and leg swelling.  Gastrointestinal: Negative for abdominal pain, anal bleeding, blood in stool, constipation, diarrhea, nausea and vomiting.  Endocrine: Negative for cold intolerance and polyuria.  Genitourinary: Negative for difficulty urinating, dysuria, frequency, hematuria and testicular pain.  Musculoskeletal: Positive for arthralgias and neck pain. Negative for back pain.  Skin: Negative for rash.  Allergic/Immunologic: Negative for environmental allergies.  Neurological: Negative for dizziness, weakness, light-headedness, numbness and headaches.  Hematological: Negative for adenopathy.  Psychiatric/Behavioral: Positive for sleep disturbance. Negative for behavioral problems and dysphoric mood.   Per HPI unless specifically indicated above     Objective:    BP (!) 148/82 (BP Location: Left Arm, Cuff Size: Normal)   Pulse 60   Temp 98.4 F (36.9 C) (Oral)   Resp 16   Ht 5\' 11"  (1.803 m)   Wt 230 lb (104.3 kg)   BMI 32.08 kg/m   Wt Readings from Last 3 Encounters:    05/19/17 230 lb (104.3 kg)  05/17/17 228 lb (103.4 kg)  01/06/17 229 lb (103.9 kg)    Physical Exam  Constitutional: He is oriented to person, place, and time. He appears well-developed and well-nourished. No distress.  Well-appearing, comfortable, cooperative  HENT:  Head: Normocephalic and atraumatic.  Mouth/Throat: Oropharynx is clear and moist.  Eyes: Pupils are equal, round, and reactive to light. Conjunctivae and EOM are normal. Right eye exhibits no discharge. Left eye exhibits no discharge.  Neck: Normal range of motion. Neck supple. No thyromegaly present.  Cardiovascular: Normal rate, regular rhythm, normal heart sounds and intact distal pulses.   No murmur heard. Pulmonary/Chest: Effort normal and breath sounds normal. No respiratory distress. He has no wheezes. He has no rales.  Abdominal: Soft. Bowel sounds are normal. He exhibits no distension and no mass.  There is no tenderness.  Musculoskeletal: Normal range of motion. He exhibits no edema or tenderness.  Upper / Lower Extremities: - Normal muscle tone, strength bilateral upper extremities 5/5, lower extremities 5/5  Lymphadenopathy:    He has no cervical adenopathy.  Neurological: He is alert and oriented to person, place, and time.  Distal sensation intact to light touch all extremities  Skin: Skin is warm and dry. No rash noted. He is not diaphoretic. No erythema.  Psychiatric: He has a normal mood and affect. His behavior is normal.  Well groomed, good eye contact, normal speech and thoughts  Nursing note and vitals reviewed.  Results for orders placed or performed in visit on 05/17/17  Comprehensive metabolic panel  Result Value Ref Range   Glucose 112 (H) 65 - 99 mg/dL   BUN 13 6 - 24 mg/dL   Creatinine, Ser 0.85 0.76 - 1.27 mg/dL   GFR calc non Af Amer 95 >59 mL/min/1.73   GFR calc Af Amer 110 >59 mL/min/1.73   BUN/Creatinine Ratio 15 9 - 20   Sodium 142 134 - 144 mmol/L   Potassium 4.3 3.5 - 5.2 mmol/L    Chloride 98 96 - 106 mmol/L   CO2 25 20 - 29 mmol/L   Calcium 9.7 8.7 - 10.2 mg/dL   Total Protein 7.7 6.0 - 8.5 g/dL   Albumin 4.6 3.5 - 5.5 g/dL   Globulin, Total 3.1 1.5 - 4.5 g/dL   Albumin/Globulin Ratio 1.5 1.2 - 2.2   Bilirubin Total 0.4 0.0 - 1.2 mg/dL   Alkaline Phosphatase 52 39 - 117 IU/L   AST 23 0 - 40 IU/L   ALT 26 0 - 44 IU/L  Lipid panel  Result Value Ref Range   Cholesterol, Total 230 (H) 100 - 199 mg/dL   Triglycerides 120 0 - 149 mg/dL   HDL 82 >39 mg/dL   VLDL Cholesterol Cal 24 5 - 40 mg/dL   LDL Calculated 124 (H) 0 - 99 mg/dL   Chol/HDL Ratio 2.8 0.0 - 5.0 ratio  Hemoglobin A1c  Result Value Ref Range   Hgb A1c MFr Bld 6.0 (H) 4.8 - 5.6 %   Est. average glucose Bld gHb Est-mCnc 126 mg/dL  CBC with Differential/Platelet  Result Value Ref Range   WBC 8.2 3.4 - 10.8 x10E3/uL   RBC 5.05 4.14 - 5.80 x10E6/uL   Hemoglobin 14.4 13.0 - 17.7 g/dL   Hematocrit 43.5 37.5 - 51.0 %   MCV 86 79 - 97 fL   MCH 28.5 26.6 - 33.0 pg   MCHC 33.1 31.5 - 35.7 g/dL   RDW 14.2 12.3 - 15.4 %   Platelets 453 (H) 150 - 379 x10E3/uL   Neutrophils 66 Not Estab. %   Lymphs 25 Not Estab. %   Monocytes 9 Not Estab. %   Eos 0 Not Estab. %   Basos 0 Not Estab. %   Neutrophils Absolute 5.4 1.4 - 7.0 x10E3/uL   Lymphocytes Absolute 2.0 0.7 - 3.1 x10E3/uL   Monocytes Absolute 0.7 0.1 - 0.9 x10E3/uL   EOS (ABSOLUTE) 0.0 0.0 - 0.4 x10E3/uL   Basophils Absolute 0.0 0.0 - 0.2 x10E3/uL   Immature Granulocytes 0 Not Estab. %   Immature Grans (Abs) 0.0 0.0 - 0.1 x10E3/uL      Assessment & Plan:   Problem List Items Addressed This Visit    Thrombocytosis (HCC)    Stable without significant change, chronic problem, history of thrombocytosis - Again, discussion despite he  was followed for labs previously and saw Heme/Onc, but does not know the plan or treatment. - Agree to hold further testing for now and work on correcting HTN and possible OSA, in future will explore second opinion  from Hematology if needed      Suspected sleep apnea    Persistent clinical concern for suspected obstructive sleep apnea given reported symptoms with snoring and sleep disturbance, fatigue and some excessive sleepiness. - Screening: ESS score 12 / STOP-Bang Score 6 = High Risk - Neck Circumference: 19.5" - Co-morbidities: HTN  Plan: 1. Discussion on initial diagnosis and testing for OSA, risk factors, management, complications 2. Agree to proceed with sleep study testing based on clinical concerns - advised patient to first contact insurance to determine which route for sleep study, Sleep Center vs In Home, notify office once determined, and will place order at that time to initiate testing      Resistant hypertension    Stable elevated resistant HTN. Out of 2 BP medications today, also L knee pain. - Does not have BP log but home checks reportedly still elevated. - Still poor lifestyle habits (poor diet / no regular exercise) - No known complications, asymptomatic - Concern for possible secondary etiology for HTN (still consider OSA), given refractory to 3 medications, seems to be adhering to therapy - Also considered possible hyperaldo or endocrine etiology for secondary HTN, labs from 08/2016, he never re-scheduled apt, but acknowledges this now  Plan: - Discussion on possible underlying OSA for secondary HTN - he should contact ins to explore PSG options, then will order and pursue study with some high risk screening exam done today 1. Continue Metoprolol XL 25mg  daily, Amlodipine 10, HCTZ 25mg , Lisinopril 40 2. Emphasized importance of home BP monitoring, keep hand written log, check daily, bring to next visit 3. Again reviewed lifestyle recommendations adding aerobic exercise (difference between walking short distances during work out of car, and a sustained regular brisk walk for goal of exercise few times week), low sodium diet 4. He will contact [facility] Endocrinology again to review  abnormal labs and discuss secondary HTN 5. Future consider switch ACEi/HCTZ to ARB/Chlorthalidone as possibility 6. Follow-up q 3 months      Pre-diabetes    Well-controlled Pre-DM with A1c 6.0 (improved from 6.2 and higher) Concern with obesity, HTN, HLD  Plan:  1. Continue Metformin 500mg  daily 2. Encourage improved lifestyle - low carb, low sugar diet, reduce portion size, continue improving regular exercise 3. Follow-up q 3 months preDM A1c      Obesity (BMI 30.0-34.9)   Hyperlipidemia    Uncontrolled cholesterol poor lifestyle Last lipid panel 04/2017 Calculated ASCVD 10 yr risk score >11% up to 20% based on uncontrolled HTN  Plan: 1. Restart ASA 81mg  for primary ASCVD risk reduction - he had stopped before 2. Again counseling on reduce ASCVD risk - strong recommendation for statin, but he is not quite ready, will re-consider, reviewed dosing 3. Encourage improved lifestyle - low carb/cholesterol, reduce portion size, continue improving regular exercise 4. Follow-up       Other Visit Diagnoses    Annual physical exam    -  Primary   Neck muscle spasm     - Recommend inc dose Tylenol and Baclofen, per AVS - Follow-up if not improved       No orders of the defined types were placed in this encounter.   Follow up plan: Return in about 3 months (around 08/19/2017) for HTN (endo f/u), ?  OSA, HLD meds, PreDM A1c.  Darrell Jackson, [facility] 05/20/2017, 12:26 AM

## 2017-05-19 NOTE — Patient Instructions (Addendum)
Thank you for coming to the clinic today.  1. For neck pain and muscle spasm - Increase use of Baclofen muscle relaxant - May take HALF pill in morning first wake up, if it does not make you too groggy and sedated for later in the day with driving - Otherwise may focus on taking one pill right after home from work and one before evening  For Tylenol - use Extra Strength 500mg  or Arthritis Strength 650mg  - take TWO pills per dose, 3 times a day  2. Platelets still mildly elevated.  - Take OTC low dose baby Enteric Coated ASPIRIN 81mg  once daily - reduce risk of heart attack and stroke  3. Recommend cholesterol Statin medication - generic Rosuvastatin or Atorvastatin, these are once a day, at bedtime, to lower risk of heart attack and stroke and also help control cholesterol. - Side effects are muscle aches and cramping, less often at low doses - Start with ONCE a week, then gradually increase to TWICE weekly, then EVERY OTHER DAY, eventually if tolerating well after 1-2 months can take EVERY NIGHT  CALL IF YOU WANT TO SEND RX IN SOONER TO START  Referral previously placed in 08/2016 for Endocrinology due to uncontrolled Hypertension. You were unable to schedule this apt.  [facility] [address] [place], [place]  [address] Phone: [facility] [place], [place]  [address] Phone: [phone]  A. Lavone Orn, MD ([place]) Adella Hare, MD ([place]) Stacie Glaze, MD, ECNU ([place])  4.  Please call your insurance company to check on how to proceed with Sleep Study, either at [facility] or In [place] - it is called a Polysomnography. - Find out COST and COVERAGE of the testing and of the CPAP machine - Diagnosis is "Suspected SLeep Apnea"  Sleep Hygiene Recommendations to promote healthy sleep in all patients, especially if symptoms of insomnia are worsening. Due to the nature of sleep rhythms,  if your body gets "out of rhythm", it may take some time before your sleep cycle can be "reset".  Please try to follow as many of the following tips as you can, usually there are only a few of these are the primary cause of the problem.  ?To reset your sleep rhythm, go to bed and get up at the same time every day ?Sleep only long enough to feel rested and then get out of bed ?Do not try to force yourself to sleep. If you can't sleep, get out of bed and try again later. ?Avoid naps during the day, unless excessively tired. The more sleeping during the day, then the less sleep your body needs at night.  ?Have coffee, tea, and other foods that have caffeine only in the morning ?Exercise several days a week, but not right before bed ?If you drink alcohol, prefer to have appropriate drink with one meal, but prefer to avoid alcohol in the evening, and bedtime ?If you smoke, avoid smoking, especially in the evening  ?Avoid watching TV or looking at phones, computers, or reading devices ("e-books") that give off light at least 30 minutes before bed. This artificial light sends "awake signals" to your brain and can make it harder to fall asleep. ?Make your bedroom a comfortable place where it is easy to fall asleep: ? Put up shades or special blackout curtains to block light from outside. ? Use a white noise machine to block noise. ? Keep the temperature cool. ?Try  your best to solve or at least address your problems before you go to bed ?Use relaxation techniques to manage stress. Ask your health care provider to suggest some techniques that may work well for you. These may include: ? Breathing exercises. ? Routines to release muscle tension. ? Visualizing peaceful scenes.   Please schedule a Follow-up Appointment to: Return in about 3 months (around 08/19/2017) for HTN (endo f/u), ?OSA, HLD meds, PreDM A1c.  If you have any other questions or concerns, please feel free to call the clinic or send  a message through [facility]. You may also schedule an earlier appointment if necessary.  Additionally, you may be receiving a survey about your experience at our clinic within a few days to 1 week by e-mail or mail. We value your feedback.  [name], DO [place]

## 2017-05-20 NOTE — Assessment & Plan Note (Signed)
Stable elevated resistant HTN. Out of 2 BP medications today, also L knee pain. - Does not have BP log but home checks reportedly still elevated. - Still poor lifestyle habits (poor diet / no regular exercise) - No known complications, asymptomatic - Concern for possible secondary etiology for HTN (still consider OSA), given refractory to 3 medications, seems to be adhering to therapy - Also considered possible hyperaldo or endocrine etiology for secondary HTN, labs from 08/2016, he never re-scheduled apt, but acknowledges this now  Plan: - Discussion on possible underlying OSA for secondary HTN - he should contact ins to explore PSG options, then will order and pursue study with some high risk screening exam done today 1. Continue Metoprolol XL 25mg  daily, Amlodipine 10, HCTZ 25mg , Lisinopril 40 2. Emphasized importance of home BP monitoring, keep hand written log, check daily, bring to next visit 3. Again reviewed lifestyle recommendations adding aerobic exercise (difference between walking short distances during work out of car, and a sustained regular brisk walk for goal of exercise few times week), low sodium diet 4. He will contact [facility] again to review abnormal labs and discuss secondary HTN 5. Future consider switch ACEi/HCTZ to ARB/Chlorthalidone as possibility 6. Follow-up q 3 months

## 2017-05-20 NOTE — Assessment & Plan Note (Signed)
Uncontrolled cholesterol poor lifestyle Last lipid panel 04/2017 Calculated ASCVD 10 yr risk score >11% up to 20% based on uncontrolled HTN  Plan: 1. Restart ASA 81mg  for primary ASCVD risk reduction - he had stopped before 2. Again counseling on reduce ASCVD risk - strong recommendation for statin, but he is not quite ready, will re-consider, reviewed dosing 3. Encourage improved lifestyle - low carb/cholesterol, reduce portion size, continue improving regular exercise 4. Follow-up

## 2017-05-20 NOTE — Assessment & Plan Note (Signed)
Persistent clinical concern for suspected obstructive sleep apnea given reported symptoms with snoring and sleep disturbance, fatigue and some excessive sleepiness. - Screening: ESS score 12 / STOP-Bang Score 6 = High Risk - Neck Circumference: 19.5" - Co-morbidities: HTN  Plan: 1. Discussion on initial diagnosis and testing for OSA, risk factors, management, complications 2. Agree to proceed with sleep study testing based on clinical concerns - advised patient to first contact insurance to determine which route for sleep study, Sleep Center vs In Home, notify office once determined, and will place order at that time to initiate testing

## 2017-05-20 NOTE — Assessment & Plan Note (Signed)
Stable without significant change, chronic problem, history of thrombocytosis - Again, discussion despite he was followed for labs previously and saw Heme/Onc, but does not know the plan or treatment. - Agree to hold further testing for now and work on correcting HTN and possible OSA, in future will explore second opinion from Hematology if needed

## 2017-05-20 NOTE — Assessment & Plan Note (Signed)
Well-controlled Pre-DM with A1c 6.0 (improved from 6.2 and higher) Concern with obesity, HTN, HLD  Plan:  1. Continue Metformin 500mg  daily 2. Encourage improved lifestyle - low carb, low sugar diet, reduce portion size, continue improving regular exercise 3. Follow-up q 3 months preDM A1c

## 2017-06-17 ENCOUNTER — Other Ambulatory Visit: Payer: Self-pay

## 2017-06-17 DIAGNOSIS — M1712 Unilateral primary osteoarthritis, left knee: Secondary | ICD-10-CM

## 2017-06-17 DIAGNOSIS — M25562 Pain in left knee: Principal | ICD-10-CM

## 2017-06-17 DIAGNOSIS — G8929 Other chronic pain: Secondary | ICD-10-CM

## 2017-06-17 MED ORDER — NAPROXEN 500 MG PO TABS: 500.0000 mg | ORAL_TABLET | Freq: Two times a day (BID) | ORAL | 1 refills | Status: DC

## 2017-09-03 ENCOUNTER — Other Ambulatory Visit: Payer: Self-pay

## 2017-09-03 DIAGNOSIS — M1712 Unilateral primary osteoarthritis, left knee: Secondary | ICD-10-CM

## 2017-09-03 DIAGNOSIS — M62838 Other muscle spasm: Secondary | ICD-10-CM

## 2017-09-03 MED ORDER — BACLOFEN 10 MG PO TABS: 5.0000 mg | ORAL_TABLET | Freq: Every day | ORAL | 1 refills | Status: DC

## 2017-09-03 NOTE — Telephone Encounter (Signed)
[facility] has requested 09/03/17 a refill on baclofen 10mg  tabs.  For 90 day supply.

## 2017-09-17 ENCOUNTER — Encounter: Payer: Self-pay | Admitting: Family Medicine

## 2017-09-17 ENCOUNTER — Ambulatory Visit: Payer: 59 | Admitting: Family Medicine

## 2017-09-17 VITALS — BP 150/82 | HR 70 | Resp 16 | Ht 71.0 in | Wt 221.6 lb

## 2017-09-17 DIAGNOSIS — M7551 Bursitis of right shoulder: Secondary | ICD-10-CM | POA: Diagnosis not present

## 2017-09-17 DIAGNOSIS — M19111 Post-traumatic osteoarthritis, right shoulder: Secondary | ICD-10-CM | POA: Diagnosis not present

## 2017-09-17 DIAGNOSIS — Z23 Encounter for immunization: Secondary | ICD-10-CM

## 2017-09-17 DIAGNOSIS — M25511 Pain in right shoulder: Secondary | ICD-10-CM

## 2017-09-17 MED ORDER — LIDOCAINE HCL (PF) 1 % IJ SOLN
4.0000 mL | Freq: Once | INTRAMUSCULAR | Status: AC
  Administered 2017-09-17: 4 mL

## 2017-09-17 MED ORDER — METHYLPREDNISOLONE ACETATE 40 MG/ML IJ SUSP
40.0000 mg | Freq: Once | INTRAMUSCULAR | Status: AC
  Administered 2017-09-17: 40 mg via INTRA_ARTICULAR
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
## 2017-09-17 NOTE — Patient Instructions (Addendum)
Thank you for coming to the office today.  1. You received a Right Shoulder Joint steroid injection today. - Lidocaine numbing medicine may ease the pain initially for a few hours until it wears off - As discussed, you may experience a "steroid flare" this evening or within 24-48 hours, anytime medicine is injected into an inflamed joint it can cause the pain to get worse temporarily - Everyone responds differently to these injections, it depends on the patient and the severity of the joint problem, it may provide anywhere from days to weeks, to months of relief. Ideal response is >6 months relief - Try to take it easy for next 1-2 days, avoid over activity and strain on joint (limit lifting for shoulder) - Recommend the following:   - For swelling - rest, compression sleeve / ACE wrap, elevation, and ice packs as needed for first few days   - For pain in future may use heating pad or moist heat as needed  Medication  Recommend trial of Anti-inflammatory with Naproxen (Naprosyn) 500mg  tabs - take one with food and plenty of water TWICE daily every day (breakfast and dinner), for next 2 to 4 weeks, then you may take only as needed - DO NOT TAKE any ibuprofen, aleve, motrin while you are taking this medicine - It is safe to take Tylenol Ext Str 500mg  tabs - take 1 to 2 (max dose 1000mg ) every 6 hours as needed for breakthrough pain, max 24 hour daily dose is 6 to 8 tablets or 4000mg   X-ray ordered for next week - you may WALK IN for X-ray on Monday  We can consider future physical therapy if needed or second opinion.  Please schedule a Follow-up Appointment to: Return in about 4 weeks (around 10/15/2017) for Right shoulder pain.    If you have any other questions or concerns, please feel free to call the office or send a message through [place]. You may also schedule an earlier appointment if necessary.  Additionally, you may be receiving a survey about your experience at our office within a  few days to 1 week by e-mail or mail. We value your feedback.  [name], DO [place]   Shoulder Exercises Ask your health care provider which exercises are safe for you. Do exercises exactly as told by your health care provider and adjust them as directed. It is normal to feel mild stretching, pulling, tightness, or discomfort as you do these exercises, but you should stop right away if you feel sudden pain or your pain gets worse.Do not begin these exercises until told by your health care provider. RANGE OF MOTION EXERCISES These exercises warm up your muscles and joints and improve the movement and flexibility of your shoulder. These exercises also help to relieve pain, numbness, and tingling. These exercises involve stretching your injured shoulder directly. Exercise A: Pendulum  1. Stand near a wall or a surface that you can hold onto for balance. 2. Bend at the waist and let your left / right arm hang straight down. Use your other arm to support you. Keep your back straight and do not lock your knees. 3. Relax your left / right arm and shoulder muscles, and move your hips and your trunk so your left / right arm swings freely. Your arm should swing because of the motion of your body, not because you are using your arm or shoulder muscles. 4. Keep moving your body so your arm swings in the following  directions, as told by your health care provider: ? Side to side. ? Forward and backward. ? In clockwise and counterclockwise circles. 5. Continue each motion for __________ seconds, or for as long as told by your health care provider. 6. Slowly return to the starting position. Repeat __________ times. Complete this exercise __________ times a day. Exercise B:Flexion, Standing  1. Stand and hold a broomstick, a cane, or a similar object. Place your hands a little more than shoulder-width apart on the object. Your left / right hand should be palm-up, and  your other hand should be palm-down. 2. Keep your elbow straight and keep your shoulder muscles relaxed. Push the stick down with your healthy arm to raise your left / right arm in front of your body, and then over your head until you feel a stretch in your shoulder. ? Avoid shrugging your shoulder while you raise your arm. Keep your shoulder blade tucked down toward the middle of your back. 3. Hold for __________ seconds. 4. Slowly return to the starting position. Repeat __________ times. Complete this exercise __________ times a day. Exercise C: Abduction, Standing 1. Stand and hold a broomstick, a cane, or a similar object. Place your hands a little more than shoulder-width apart on the object. Your left / right hand should be palm-up, and your other hand should be palm-down. 2. While keeping your elbow straight and your shoulder muscles relaxed, push the stick across your body toward your left / right side. Raise your left / right arm to the side of your body and then over your head until you feel a stretch in your shoulder. ? Do not raise your arm above shoulder height, unless your health care provider tells you to do that. ? Avoid shrugging your shoulder while you raise your arm. Keep your shoulder blade tucked down toward the middle of your back. 3. Hold for __________ seconds. 4. Slowly return to the starting position. Repeat __________ times. Complete this exercise __________ times a day. Exercise D:Internal Rotation  1. Place your left / right hand behind your back, palm-up. 2. Use your other hand to dangle an exercise band, a towel, or a similar object over your shoulder. Grasp the band with your left / right hand so you are holding onto both ends. 3. Gently pull up on the band until you feel a stretch in the front of your left / right shoulder. ? Avoid shrugging your shoulder while you raise your arm. Keep your shoulder blade tucked down toward the middle of your back. 4. Hold for  __________ seconds. 5. Release the stretch by letting go of the band and lowering your hands. Repeat __________ times. Complete this exercise __________ times a day. STRETCHING EXERCISES These exercises warm up your muscles and joints and improve the movement and flexibility of your shoulder. These exercises also help to relieve pain, numbness, and tingling. These exercises are done using your healthy shoulder to help stretch the muscles of your injured shoulder. Exercise E: Warehouse manager (External Rotation and Abduction)  1. Stand in a doorway with one of your feet slightly in front of the other. This is called a staggered stance. If you cannot reach your forearms to the door frame, stand facing a corner of a room. 2. Choose one of the following positions as told by your health care provider: ? Place your hands and forearms on the door frame above your head. ? Place your hands and forearms on the door frame at the height  of your head. ? Place your hands on the door frame at the height of your elbows. 3. Slowly move your weight onto your front foot until you feel a stretch across your chest and in the front of your shoulders. Keep your head and chest upright and keep your abdominal muscles tight. 4. Hold for __________ seconds. 5. To release the stretch, shift your weight to your back foot. Repeat __________ times. Complete this stretch __________ times a day. Exercise F:Extension, Standing 1. Stand and hold a broomstick, a cane, or a similar object behind your back. ? Your hands should be a little wider than shoulder-width apart. ? Your palms should face away from your back. 2. Keeping your elbows straight and keeping your shoulder muscles relaxed, move the stick away from your body until you feel a stretch in your shoulder. ? Avoid shrugging your shoulders while you move the stick. Keep your shoulder blade tucked down toward the middle of your back. 3. Hold for __________  seconds. 4. Slowly return to the starting position. Repeat __________ times. Complete this exercise __________ times a day. STRENGTHENING EXERCISES These exercises build strength and endurance in your shoulder. Endurance is the ability to use your muscles for a long time, even after they get tired. Exercise G:External Rotation  1. Sit in a stable chair without armrests. 2. Secure an exercise band at elbow height on your left / right side. 3. Place a soft object, such as a folded towel or a small pillow, between your left / right upper arm and your body to move your elbow a few inches away (about 10 cm) from your side. 4. Hold the end of the band so it is tight and there is no slack. 5. Keeping your elbow pressed against the soft object, move your left / right forearm out, away from your abdomen. Keep your body steady so only your forearm moves. 6. Hold for __________ seconds. 7. Slowly return to the starting position. Repeat __________ times. Complete this exercise __________ times a day. Exercise H:Shoulder Abduction  1. Sit in a stable chair without armrests, or stand. 2. Hold a __________ weight in your left / right hand, or hold an exercise band with both hands. 3. Start with your arms straight down and your left / right palm facing in, toward your body. 4. Slowly lift your left / right hand out to your side. Do not lift your hand above shoulder height unless your health care provider tells you that this is safe. ? Keep your arms straight. ? Avoid shrugging your shoulder while you do this movement. Keep your shoulder blade tucked down toward the middle of your back. 5. Hold for __________ seconds. 6. Slowly lower your arm, and return to the starting position. Repeat __________ times. Complete this exercise __________ times a day. Exercise I:Shoulder Extension 1. Sit in a stable chair without armrests, or stand. 2. Secure an exercise band to a stable object in front of you where it  is at shoulder height. 3. Hold one end of the exercise band in each hand. Your palms should face each other. 4. Straighten your elbows and lift your hands up to shoulder height. 5. Step back, away from the secured end of the exercise band, until the band is tight and there is no slack. 6. Squeeze your shoulder blades together as you pull your hands down to the sides of your thighs. Stop when your hands are straight down by your sides. Do not let your hands  go behind your body. 7. Hold for __________ seconds. 8. Slowly return to the starting position. Repeat __________ times. Complete this exercise __________ times a day. Exercise J:Standing Shoulder Row 1. Sit in a stable chair without armrests, or stand. 2. Secure an exercise band to a stable object in front of you so it is at waist height. 3. Hold one end of the exercise band in each hand. Your palms should be in a thumbs-up position. 4. Bend each of your elbows to an "L" shape (about 90 degrees) and keep your upper arms at your sides. 5. Step back until the band is tight and there is no slack. 6. Slowly pull your elbows back behind you. 7. Hold for __________ seconds. 8. Slowly return to the starting position. Repeat __________ times. Complete this exercise __________ times a day. Exercise K:Shoulder Press-Ups  1. Sit in a stable chair that has armrests. Sit upright, with your feet flat on the floor. 2. Put your hands on the armrests so your elbows are bent and your fingers are pointing forward. Your hands should be about even with the sides of your body. 3. Push down on the armrests and use your arms to lift yourself off of the chair. Straighten your elbows and lift yourself up as much as you comfortably can. ? Move your shoulder blades down, and avoid letting your shoulders move up toward your ears. ? Keep your feet on the ground. As you get stronger, your feet should support less of your body weight as you lift yourself up. 4. Hold  for __________ seconds. 5. Slowly lower yourself back into the chair. Repeat __________ times. Complete this exercise __________ times a day. Exercise L: Wall Push-Ups  1. Stand so you are facing a stable wall. Your feet should be about one arm-length away from the wall. 2. Lean forward and place your palms on the wall at shoulder height. 3. Keep your feet flat on the floor as you bend your elbows and lean forward toward the wall. 4. Hold for __________ seconds. 5. Straighten your elbows to push yourself back to the starting position. Repeat __________ times. Complete this exercise __________ times a day. This information is not intended to replace advice given to you by your health care provider. Make sure you discuss any questions you have with your health care provider. Document Released: 07/22/2005 Document Revised: 06/01/2016 Document Reviewed: 05/19/2015 Elsevier Interactive Patient Education  2018 [employer].

## 2017-09-20 ENCOUNTER — Ambulatory Visit
Admission: RE | Admit: 2017-09-20 | Discharge: 2017-09-20 | Disposition: A | Discharge status: Home / Self Care | Payer: 59 | Source: Ambulatory Visit | Attending: Family Medicine | Admitting: Family Medicine

## 2017-09-20 ENCOUNTER — Encounter: Payer: Self-pay | Admitting: Family Medicine

## 2017-09-20 ENCOUNTER — Ambulatory Visit
Admission: RE | Admit: 2017-09-20 | Discharge: 2017-09-20 | Disposition: A | Discharge status: Home / Self Care | Payer: 59 | Source: Intra-hospital | Attending: Family Medicine | Admitting: Family Medicine

## 2017-09-20 DIAGNOSIS — M19111 Post-traumatic osteoarthritis, right shoulder: Secondary | ICD-10-CM

## 2017-09-20 DIAGNOSIS — M7551 Bursitis of right shoulder: Secondary | ICD-10-CM

## 2017-09-20 DIAGNOSIS — X58XXXA Exposure to other specified factors, initial encounter: Secondary | ICD-10-CM | POA: Diagnosis not present

## 2017-09-20 DIAGNOSIS — S42021A Displaced fracture of shaft of right clavicle, initial encounter for closed fracture: Secondary | ICD-10-CM | POA: Diagnosis not present

## 2017-09-30 ENCOUNTER — Ambulatory Visit: Payer: 59 | Admitting: Family Medicine

## 2017-10-01 ENCOUNTER — Ambulatory Visit: Payer: 59 | Admitting: Family Medicine

## 2017-10-01 ENCOUNTER — Encounter: Payer: Self-pay | Admitting: Family Medicine

## 2017-10-01 VITALS — BP 168/95 | HR 83 | Temp 98.7°F | Resp 16 | Ht 71.0 in | Wt 233.0 lb

## 2017-10-01 DIAGNOSIS — M25511 Pain in right shoulder: Secondary | ICD-10-CM | POA: Diagnosis not present

## 2017-10-01 DIAGNOSIS — M62838 Other muscle spasm: Secondary | ICD-10-CM | POA: Diagnosis not present

## 2017-10-01 DIAGNOSIS — M1712 Unilateral primary osteoarthritis, left knee: Secondary | ICD-10-CM | POA: Diagnosis not present

## 2017-10-01 MED ORDER — BACLOFEN 10 MG PO TABS: 5.0000 mg | ORAL_TABLET | Freq: Every day | ORAL | 2 refills | Status: DC
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
## 2017-10-01 NOTE — Patient Instructions (Addendum)
Thank you for coming to the office today.  1.  Continue Naproxen twice daily for 2 weeks  Continue Tylenol  Sent new rx Baclofen to pharmacy take only at bedtime or if not driving, caution sedation  [facility] [place], [place]  [address] Phone: [phone]  Referred you to Orthopedic doctor and also Physical Therapist - Mariah Moger  Please schedule a Follow-up Appointment to: Return if symptoms worsen or fail to improve, for r shoulder pain.   If you have any other questions or concerns, please feel free to call the office or send a message through [place]. You may also schedule an earlier appointment if necessary.  Additionally, you may be receiving a survey about your experience at our office within a few days to 1 week by e-mail or mail. We value your feedback.  [name], DO [place]

## 2017-10-05 ENCOUNTER — Ambulatory Visit: Payer: 59 | Admitting: Family Medicine

## 2017-10-22 ENCOUNTER — Other Ambulatory Visit: Payer: Self-pay | Admitting: Orthopedic Surgery

## 2017-10-22 DIAGNOSIS — M25511 Pain in right shoulder: Secondary | ICD-10-CM

## 2017-10-28 ENCOUNTER — Telehealth: Payer: Self-pay | Admitting: Family Medicine

## 2017-10-28 ENCOUNTER — Other Ambulatory Visit: Payer: Self-pay

## 2017-10-28 DIAGNOSIS — M109 Gout, unspecified: Secondary | ICD-10-CM

## 2017-10-28 MED ORDER — COLCHICINE 0.6 MG PO TABS: 0.6000 mg | ORAL_TABLET | Freq: Every day | ORAL | 3 refills | Status: DC

## 2017-10-28 NOTE — Telephone Encounter (Signed)
Send for approval. 

## 2017-10-28 NOTE — Telephone Encounter (Signed)
Pt. Called requesting refill on  colchicine  0.6 mg  Called into   [facility]

## 2017-11-26 ENCOUNTER — Other Ambulatory Visit: Payer: Self-pay | Admitting: Orthopedic Surgery

## 2018-02-08 ENCOUNTER — Other Ambulatory Visit: Payer: Self-pay

## 2018-02-08 DIAGNOSIS — M109 Gout, unspecified: Secondary | ICD-10-CM

## 2018-02-08 MED ORDER — COLCHICINE 0.6 MG PO TABS: 0.6000 mg | ORAL_TABLET | Freq: Every day | ORAL | 3 refills | Status: DC

## 2018-02-17 ENCOUNTER — Other Ambulatory Visit: Payer: Self-pay

## 2018-02-17 DIAGNOSIS — M109 Gout, unspecified: Secondary | ICD-10-CM

## 2018-02-17 MED ORDER — COLCHICINE 0.6 MG PO TABS: 0.6000 mg | ORAL_TABLET | Freq: Every day | ORAL | 3 refills | Status: DC

## 2018-03-18 LAB — LIPID PANEL
Cholesterol: 231 — AB (ref 0–200)
HDL: 58 (ref 35–70)
LDL Cholesterol: 145
TRIGLYCERIDES: 138 (ref 40–160)

## 2018-03-18 LAB — BASIC METABOLIC PANEL: GLUCOSE: 136

## 2018-03-18 LAB — HEMOGLOBIN A1C: HEMOGLOBIN A1C: 6.4 — AB (ref 4.0–6.0)

## 2018-03-31 ENCOUNTER — Ambulatory Visit: Payer: 59 | Admitting: Family Medicine

## 2018-03-31 ENCOUNTER — Encounter: Payer: Self-pay | Admitting: Family Medicine

## 2018-03-31 ENCOUNTER — Ambulatory Visit
Admission: RE | Admit: 2018-03-31 | Discharge: 2018-03-31 | Disposition: A | Discharge status: Home / Self Care | Payer: Managed Care, Other (non HMO) | Source: Ambulatory Visit | Attending: Family Medicine | Admitting: Family Medicine

## 2018-03-31 VITALS — BP 155/88 | HR 65 | Temp 98.6°F | Resp 16 | Ht 71.0 in | Wt 232.0 lb

## 2018-03-31 DIAGNOSIS — S91312A Laceration without foreign body, left foot, initial encounter: Secondary | ICD-10-CM

## 2018-03-31 DIAGNOSIS — M79672 Pain in left foot: Secondary | ICD-10-CM

## 2018-03-31 DIAGNOSIS — M25572 Pain in left ankle and joints of left foot: Secondary | ICD-10-CM | POA: Insufficient documentation

## 2018-03-31 DIAGNOSIS — I10 Essential (primary) hypertension: Secondary | ICD-10-CM | POA: Diagnosis not present

## 2018-03-31 DIAGNOSIS — W11XXXA Fall on and from ladder, initial encounter: Secondary | ICD-10-CM | POA: Diagnosis not present

## 2018-03-31 DIAGNOSIS — M7989 Other specified soft tissue disorders: Secondary | ICD-10-CM | POA: Insufficient documentation

## 2018-03-31 MED ORDER — FUROSEMIDE 20 MG PO TABS: 20.0000 mg | ORAL_TABLET | Freq: Every day | ORAL | 0 refills | Status: DC

## 2018-03-31 MED ORDER — CEPHALEXIN 500 MG PO CAPS: 500.0000 mg | ORAL_CAPSULE | Freq: Three times a day (TID) | ORAL | 0 refills | Status: DC

## 2018-03-31 MED ORDER — HYDROCHLOROTHIAZIDE 25 MG PO TABS: 25.0000 mg | ORAL_TABLET | Freq: Every day | ORAL | 3 refills | Status: DC
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
## 2018-03-31 NOTE — Patient Instructions (Addendum)
Thank you for coming to the office today.  X-rays today stay tuned for results - if no fracture then concern significant left medial ankle sprain - will need immobilization for period of time if pain on weight bearing. Try Clyde Canterbury - given rx  [facility] [address] [place], [place] [address] Open until [place] Phone: [phone]  [place], [place] [address] Ph: [phone]  Ankle Sprain It seems like you have a mild to moderate ankle sprain, this should gradually heal on its own, but it does take time to get back to 100% normal. The biggest concern is to avoid any re-injury as your ankle is now weaker while it is healing. Please try to avoid any activities that cause pain. It is important to modify your activities to allow this to heal. You may benefit from an Ankle Brace (over the counter) to limit movement in your ankle If you are unable to walk without pain after a few days, then crutches can be used  Use RICE therapy: - R - Rest / relative rest with activity modification avoid overuse of joint - I - Ice packs (make sure you use a towel or sock / something to protect skin) - C - Compression with ACE wrap to apply pressure and reduce swelling allowing more support - E - Elevation - if significant swelling, lift leg above heart level (toes above your nose) to help reduce swelling, most helpful at night after day of being on your feet  Ice the area for 20 minutes at least 3-4 times a day Wrap the ankle with a compression wrap to prevent swelling Elevate the leg above the level of the heart as much as possible  Start antibiotic 3 times daily for 7 days to prevent infection, continue with topical neosporin  Continue on Ibuprofen and Tylenol for pain.  Take fluid pill Lasix (furosemide) 20mg  daily for 3-5 days to help swelling - and if need can take 2nd dose in a day. Continue the Hydrochlorothiazide  Please schedule a  Follow-up Appointment to: Return in about 1 week (around 04/07/2018), or if symptoms worsen or fail to improve, for left foot ankle injury.  If you have any other questions or concerns, please feel free to call the office or send a message through [place]. You may also schedule an earlier appointment if necessary.  Additionally, you may be receiving a survey about your experience at our office within a few days to 1 week by e-mail or mail. We value your feedback.  [name], DO [place]

## 2018-04-03 ENCOUNTER — Encounter: Payer: Self-pay | Admitting: Family Medicine

## 2018-04-04 ENCOUNTER — Telehealth: Payer: Self-pay | Admitting: Family Medicine

## 2018-04-04 NOTE — Telephone Encounter (Signed)
Pt's foot is not healing and would like an extension on note for work to be out until the 19th.  His call back number is [phone]

## 2018-04-04 NOTE — Telephone Encounter (Signed)
Letter was given to patient by Dr. Raliegh Ip

## 2018-04-06 ENCOUNTER — Ambulatory Visit: Payer: Managed Care, Other (non HMO) | Admitting: Family Medicine

## 2018-04-06 ENCOUNTER — Encounter: Payer: Self-pay | Admitting: Family Medicine

## 2018-04-06 VITALS — BP 140/80 | HR 91 | Temp 98.8°F | Resp 16 | Ht 71.0 in | Wt 229.0 lb

## 2018-04-06 DIAGNOSIS — M7989 Other specified soft tissue disorders: Secondary | ICD-10-CM

## 2018-04-06 DIAGNOSIS — S91312D Laceration without foreign body, left foot, subsequent encounter: Secondary | ICD-10-CM

## 2018-04-06 DIAGNOSIS — S93602D Unspecified sprain of left foot, subsequent encounter: Secondary | ICD-10-CM

## 2018-04-06 DIAGNOSIS — M79672 Pain in left foot: Secondary | ICD-10-CM | POA: Diagnosis not present

## 2018-04-06 NOTE — Progress Notes (Signed)
Subjective:    Patient ID: Darrell Abu Sr., male    DOB: September 04, 1956, 62 y.o.   MRN: [id number]  Darrell Straka Moser Sr. is a 62 y.o. male presenting on 04/06/2018 for Foot Swelling (Left side, paperwork) and Foot Pain (Left foot sprain, laceration)   HPI  FOLLOW-UP Left Foot Sprain, pain, swelling, laceration - from fall injury - Last visit with me 03/31/18, for initial visit for same problem with non-work related injury (OCCURRED ON 03/25/18) while at home fall from ladder injured twisted Left foot with pain, swelling and laceration, treated with X-ray showed no acute fracture, given rx for CAM Walker Boot to rest and immobilize, rx Lasix PRN and RICE therapy for swelling and given rx antibiotic prophylaxis against infection with his laceration with Keflex, see prior notes for background information. - Interval update with he was taken out of work initially and then determined he was unable to return due to limited ability to bear weight and ambulate due to pain in Left foot. - Today patient reports now he is requesting evaluation for FMLA approval and possible Short Term Disability due to this injury as he does not feel he is capable of performing his job at this time. - He does seem to be gradually improving, but it is slow progress, and he is benefiting from rest and continued treatment of Left Foot - Regarding swelling, it is improved on lasix rx and RICE therapy, now more localized to forefoot - Regarding laceration it is healing, without redness and drainage, he keeps it coated with topical neosporin and bandage, and he is finishing Keflex. No concerns for infection - Regarding pain, it is improved with CAM walker boot, but still not able to walk long distances, improved with OTC NSAID and Tylenol PRN Admits Left leg swelling, pain with ambulation - Denies any fever, chills, sweats, drainage of pus, other joint pain or injury, headache, loss of vision, dizziness, lightheadedness, numbness  tingling    Depression screen [facility] 2/9 04/06/2018 03/31/2018 09/17/2017  Decreased Interest 0 0 0  Down, Depressed, Hopeless 0 0 0  PHQ - 2 Score 0 0 0    Social History   Tobacco Use  . Smoking status: Never Smoker  . Smokeless tobacco: Never Used  Substance Use Topics  . Alcohol use: Yes  . Drug use: No    Review of Systems Per HPI unless specifically indicated above     Objective:    BP 140/80   Pulse 91   Temp 98.8 F (37.1 C) (Oral)   Resp 16   Ht 5\' 11"  (1.803 m)   Wt 229 lb (103.9 kg)   BMI 31.94 kg/m   Wt Readings from Last 3 Encounters:  04/06/18 229 lb (103.9 kg)  03/31/18 232 lb (105.2 kg)  10/01/17 233 lb (105.7 kg)    Physical Exam  Constitutional: He is oriented to person, place, and time. He appears well-developed and well-nourished. No distress.  Well-appearing, comfortable, cooperative  HENT:  Head: Normocephalic and atraumatic.  Mouth/Throat: Oropharynx is clear and moist.  Eyes: Conjunctivae are normal. Right eye exhibits no discharge. Left eye exhibits no discharge.  Cardiovascular: Normal rate.  Pulmonary/Chest: Effort normal.  Musculoskeletal: He exhibits edema (Improved left foot ankle and lower leg non pitting edema - now more localized to mid to forefoot and some ankle - no longer extending up leg).  Left Foot/Ankle Inspection / Palpitation: Improved edema. Laceration is healing with granulation tissue. Without erythema or drainage. see picture,  non tender, no induration. Resolved ecchymosis Left medial ankle. Non tender over medial malleolus - Now tender to palpation persistently R medial forefoot/mid foot limited on palpation mostly with weightbearing ROM: able to flex / extend dorsiflex plantar flex - movement of toes is intact but has some slight stiffness due to swelling and immobilization Strength: distal intact Neurovascular: distal intact  Able to ambulate cautiously with CAM walker boot, otherwise significant pain on weightbearing  barefoot.  Neurological: He is alert and oriented to person, place, and time.  Skin: Skin is warm and dry. No rash noted. He is not diaphoretic. No erythema.  Psychiatric: He has a normal mood and affect. His behavior is normal.  Well groomed, good eye contact, normal speech and thoughts  Nursing note and vitals reviewed.    Left Foot/Ankle    I have personally reviewed the radiology report from 03/31/18 Ankle and Foot X-rays LEFT  CLINICAL DATA: Left foot pain after fall last week.  EXAM: LEFT FOOT - COMPLETE 3+ VIEW  COMPARISON: None.  FINDINGS: No definite fracture or dislocation is noted. Dorsal soft tissue swelling is noted. Mild posterior calcaneal spurring is noted. Joint spaces are unremarkable.  IMPRESSION: Dorsal soft tissue swelling. No fracture or dislocation is noted.   Electronically Signed By: Marijo Conception, M.D. On: 03/31/2018 10:03  CLINICAL DATA: Left ankle pain after fall last week.  EXAM: LEFT ANKLE COMPLETE - 3+ VIEW  COMPARISON: None.  FINDINGS: There is no evidence of fracture, dislocation, or joint effusion. There is no evidence of arthropathy or other focal bone abnormality. Soft tissues are unremarkable.  IMPRESSION: Normal left ankle.   Electronically Signed By: Marijo Conception, M.D. On: 03/31/2018 10:06   Results for orders placed or performed in visit on 05/17/17  Comprehensive metabolic panel  Result Value Ref Range   Glucose 112 (H) 65 - 99 mg/dL   BUN 13 6 - 24 mg/dL   Creatinine, Ser 0.85 0.76 - 1.27 mg/dL   GFR calc non Af Amer 95 >59 mL/min/1.73   GFR calc Af Amer 110 >59 mL/min/1.73   BUN/Creatinine Ratio 15 9 - 20   Sodium 142 134 - 144 mmol/L   Potassium 4.3 3.5 - 5.2 mmol/L   Chloride 98 96 - 106 mmol/L   CO2 25 20 - 29 mmol/L   Calcium 9.7 8.7 - 10.2 mg/dL   Total Protein 7.7 6.0 - 8.5 g/dL   Albumin 4.6 3.5 - 5.5 g/dL   Globulin, Total 3.1 1.5 - 4.5 g/dL   Albumin/Globulin  Ratio 1.5 1.2 - 2.2   Bilirubin Total 0.4 0.0 - 1.2 mg/dL   Alkaline Phosphatase 52 39 - 117 IU/L   AST 23 0 - 40 IU/L   ALT 26 0 - 44 IU/L  Lipid panel  Result Value Ref Range   Cholesterol, Total 230 (H) 100 - 199 mg/dL   Triglycerides 120 0 - 149 mg/dL   HDL 82 >39 mg/dL   VLDL Cholesterol Cal 24 5 - 40 mg/dL   LDL Calculated 124 (H) 0 - 99 mg/dL   Chol/HDL Ratio 2.8 0.0 - 5.0 ratio  Hemoglobin A1c  Result Value Ref Range   Hgb A1c MFr Bld 6.0 (H) 4.8 - 5.6 %   Est. average glucose Bld gHb Est-mCnc 126 mg/dL  CBC with Differential/Platelet  Result Value Ref Range   WBC 8.2 3.4 - 10.8 x10E3/uL   RBC 5.05 4.14 - 5.80 x10E6/uL   Hemoglobin 14.4 13.0 - 17.7 g/dL  Hematocrit 43.5 37.5 - 51.0 %   MCV 86 79 - 97 fL   MCH 28.5 26.6 - 33.0 pg   MCHC 33.1 31.5 - 35.7 g/dL   RDW 14.2 12.3 - 15.4 %   Platelets 453 (H) 150 - 379 x10E3/uL   Neutrophils 66 Not Estab. %   Lymphs 25 Not Estab. %   Monocytes 9 Not Estab. %   Eos 0 Not Estab. %   Basos 0 Not Estab. %   Neutrophils Absolute 5.4 1.4 - 7.0 x10E3/uL   Lymphocytes Absolute 2.0 0.7 - 3.1 x10E3/uL   Monocytes Absolute 0.7 0.1 - 0.9 x10E3/uL   EOS (ABSOLUTE) 0.0 0.0 - 0.4 x10E3/uL   Basophils Absolute 0.0 0.0 - 0.2 x10E3/uL   Immature Granulocytes 0 Not Estab. %   Immature Grans (Abs) 0.0 0.0 - 0.1 x10E3/uL      Assessment & Plan:   Problem List Items Addressed This Visit    None    Visit Diagnoses    Foot sprain, left, subsequent encounter    -  Primary   Acute foot pain, left       Foot laceration, left, subsequent encounter       Left leg swelling          Clinically with gradually improving but still significantly painful Left foot sprain (non work injury) as likely etiology including secondary L foot swelling, laceration Recent L foot and Ankle X-rays above (03/31/18) - negative fracture Able to weight bear and ambulate short distance only with CAM walker boot No sign of secondary infection from  laceration  Given severity of symptoms it is medically necessary that patient remain out of work for an extended period of time 3-4 weeks to allow his Left Foot to heal prior to resuming work requiring frequent weightbearing and ambulation  Plan Continue with current treatment plan with CAM walker boot, RICE therapy, Lasix PRN swelling, finish Keflex course for cellulitis prophylaxis, NSAID / Tylenol PRN pain - Completed FMLA paperwork and Disability paperwork as requested and provided to office today, those documents were faxed 04/06/18, patient received original copies - Brief information and summary of FMLA request - continuous leave from 03/31/18 to 04/22/18, anticipated date of return to work on 04/25/18. Date of onset of injury 03/25/18. 1st treatment office visit on 7/11/9. Next scheduled visit on 04/18/18 for re-evaluation prior to return to work. Anticipated 3-4 week duration out. No referral at this time. WIll consider Orthopedics if not improving as expected.   No orders of the defined types were placed in this encounter.  Follow up plan: Return in about 12 days (around 04/18/2018) for return to work, L foot.  [name], [facility] 04/06/2018, 6:18 PM

## 2018-04-06 NOTE — Patient Instructions (Addendum)
Thank you for coming to the office today.  Faxed FMLA and Short Term Disability paperwork today - will send office note tomorrow.  You keep original copy for your records.  Keep up the good work, no changes to plan at this time, it seems like it is healing.  Schedule to see me on 7/29  Please schedule a Follow-up Appointment to: Return in about 12 days (around 04/18/2018) for return to work, L foot.  If you have any other questions or concerns, please feel free to call the office or send a message through [place]. You may also schedule an earlier appointment if necessary.  Additionally, you may be receiving a survey about your experience at our office within a few days to 1 week by e-mail or mail. We value your feedback.  [name], DO [place]

## 2018-04-07 ENCOUNTER — Encounter: Payer: Self-pay | Admitting: Family Medicine

## 2018-04-18 ENCOUNTER — Encounter: Payer: Self-pay | Admitting: Family Medicine

## 2018-04-18 ENCOUNTER — Ambulatory Visit: Payer: Managed Care, Other (non HMO) | Admitting: Family Medicine

## 2018-04-18 VITALS — BP 140/86 | HR 82 | Temp 98.7°F | Resp 16 | Ht 71.0 in | Wt 228.6 lb

## 2018-04-18 DIAGNOSIS — M79672 Pain in left foot: Secondary | ICD-10-CM | POA: Diagnosis not present

## 2018-04-18 DIAGNOSIS — M7989 Other specified soft tissue disorders: Secondary | ICD-10-CM | POA: Diagnosis not present

## 2018-04-18 DIAGNOSIS — S91312D Laceration without foreign body, left foot, subsequent encounter: Secondary | ICD-10-CM

## 2018-04-18 DIAGNOSIS — S93602D Unspecified sprain of left foot, subsequent encounter: Secondary | ICD-10-CM

## 2018-04-18 MED ORDER — SILVER SULFADIAZINE 1 % EX CREA: 1.0000 "application " | TOPICAL_CREAM | Freq: Every day | CUTANEOUS | 0 refills | Status: DC

## 2018-04-18 NOTE — Progress Notes (Signed)
Subjective:    Patient ID: Darrell Abu Sr., male    DOB: [birth year], 62 y.o.   MRN: [id number]  Darrell Turay Manocchio Sr. is a 62 y.o. male presenting on 04/18/2018 for Foot Pain (follow up)   HPI   FOLLOW-UP Left Foot Sprain, pain, swelling, laceration - from fall injury Last visit 04/06/18 for same problem, he was taken out of work no short term disability and FMLA at that time due to inability to ambulate and weight bear to work. See note for background. - Interval update - he has been able to ambulate with orthopedic support shoe, has significantly reduced pain, still some swelling of foot and redness of bunion region, laceration is healing but still present. He is doing well with this shoe, but does not think he can wear regular shoes quite yet. Taking Colchicine 0.6mg  daily limited relief, he thought may be related to gout but no improvement. Using neosporin topically daily - Today he feels that his progress is on track to return to work as planned next week on Monday 04/25/18. He feels he will still need the orthopedic shoe to walk appropriately at work. - Denies any worsening pain, swelling, new injury or fall, edema, redness, fever chills, other joint pain   Depression screen [facility] 2/9 04/18/2018 04/06/2018 03/31/2018  Decreased Interest 0 0 0  Down, Depressed, Hopeless 0 0 0  PHQ - 2 Score 0 0 0    Social History   Tobacco Use  . Smoking status: Never Smoker  . Smokeless tobacco: Never Used  Substance Use Topics  . Alcohol use: Yes  . Drug use: No    Review of Systems Per HPI unless specifically indicated above     Objective:    BP 140/86   Pulse 82   Temp 98.7 F (37.1 C) (Oral)   Resp 16   Ht 5\' 11"  (1.803 m)   Wt 228 lb 9.6 oz (103.7 kg)   BMI 31.88 kg/m   Wt Readings from Last 3 Encounters:  04/18/18 228 lb 9.6 oz (103.7 kg)  04/06/18 229 lb (103.9 kg)  03/31/18 232 lb (105.2 kg)    Physical Exam  Constitutional: He is oriented to person, place, and time. He  appears well-developed and well-nourished. No distress.  Well-appearing, comfortable, cooperative  HENT:  Head: Normocephalic and atraumatic.  Mouth/Throat: Oropharynx is clear and moist.  Eyes: Conjunctivae are normal. Right eye exhibits no discharge. Left eye exhibits no discharge.  Cardiovascular: Normal rate.  Pulmonary/Chest: Effort normal.  Musculoskeletal: He exhibits edema (Resolving edema - now only mild localized to L medial foot bunion - no longer extending up leg).  Left Foot/Ankle Inspection / Palpitation: Still improved edema some areas of peeling skin as evidence of where edema is resolving. Laceration is healing with granulation tissue still remains open but still healing. Without erythema or drainage, non tender, no induration. Resolved ecchymosis Left medial ankle. Non tender over medial malleolus - L foot with area of still mild erythema and edema at bunion region ROM: able to flex / extend dorsiflex plantar flex - movement of toes Strength: distal intact Neurovascular: distal intact  Able to ambulate with orthopedic shoe  Neurological: He is alert and oriented to person, place, and time.  Skin: Skin is warm and dry. No rash noted. He is not diaphoretic. No erythema.  Psychiatric: He has a normal mood and affect. His behavior is normal.  Well groomed, good eye contact, normal speech and thoughts  Nursing note  and vitals reviewed.  Results for orders placed or performed in visit on 05/17/17  Comprehensive metabolic panel  Result Value Ref Range   Glucose 112 (H) 65 - 99 mg/dL   BUN 13 6 - 24 mg/dL   Creatinine, Ser 0.85 0.76 - 1.27 mg/dL   GFR calc non Af Amer 95 >59 mL/min/1.73   GFR calc Af Amer 110 >59 mL/min/1.73   BUN/Creatinine Ratio 15 9 - 20   Sodium 142 134 - 144 mmol/L   Potassium 4.3 3.5 - 5.2 mmol/L   Chloride 98 96 - 106 mmol/L   CO2 25 20 - 29 mmol/L   Calcium 9.7 8.7 - 10.2 mg/dL   Total Protein 7.7 6.0 - 8.5 g/dL   Albumin 4.6 3.5 - 5.5 g/dL    Globulin, Total 3.1 1.5 - 4.5 g/dL   Albumin/Globulin Ratio 1.5 1.2 - 2.2   Bilirubin Total 0.4 0.0 - 1.2 mg/dL   Alkaline Phosphatase 52 39 - 117 IU/L   AST 23 0 - 40 IU/L   ALT 26 0 - 44 IU/L  Lipid panel  Result Value Ref Range   Cholesterol, Total 230 (H) 100 - 199 mg/dL   Triglycerides 120 0 - 149 mg/dL   HDL 82 >39 mg/dL   VLDL Cholesterol Cal 24 5 - 40 mg/dL   LDL Calculated 124 (H) 0 - 99 mg/dL   Chol/HDL Ratio 2.8 0.0 - 5.0 ratio  Hemoglobin A1c  Result Value Ref Range   Hgb A1c MFr Bld 6.0 (H) 4.8 - 5.6 %   Est. average glucose Bld gHb Est-mCnc 126 mg/dL  CBC with Differential/Platelet  Result Value Ref Range   WBC 8.2 3.4 - 10.8 x10E3/uL   RBC 5.05 4.14 - 5.80 x10E6/uL   Hemoglobin 14.4 13.0 - 17.7 g/dL   Hematocrit 43.5 37.5 - 51.0 %   MCV 86 79 - 97 fL   MCH 28.5 26.6 - 33.0 pg   MCHC 33.1 31.5 - 35.7 g/dL   RDW 14.2 12.3 - 15.4 %   Platelets 453 (H) 150 - 379 x10E3/uL   Neutrophils 66 Not Estab. %   Lymphs 25 Not Estab. %   Monocytes 9 Not Estab. %   Eos 0 Not Estab. %   Basos 0 Not Estab. %   Neutrophils Absolute 5.4 1.4 - 7.0 x10E3/uL   Lymphocytes Absolute 2.0 0.7 - 3.1 x10E3/uL   Monocytes Absolute 0.7 0.1 - 0.9 x10E3/uL   EOS (ABSOLUTE) 0.0 0.0 - 0.4 x10E3/uL   Basophils Absolute 0.0 0.0 - 0.2 x10E3/uL   Immature Granulocytes 0 Not Estab. %   Immature Grans (Abs) 0.0 0.0 - 0.1 x10E3/uL      Assessment & Plan:   Problem List Items Addressed This Visit    None    Visit Diagnoses    Foot sprain, left, subsequent encounter    -  Primary   Acute foot pain, left       Left leg swelling       Foot laceration, left, subsequent encounter       Relevant Medications   silver sulfADIAZINE (SILVADENE) 1 % cream      Clinically with gradually improving still. Now residual L foot pain and swelling mostly over bunion with weight bearing without support. Secondary superficial laceration still slowly healing without sign of complication or infection Recent L  foot and Ankle X-rays above (03/31/18) - negative fracture No sign of secondary infection from laceration  Prior FMLA / Short Term Disability -  was completed from 03/31/18 to 04/24/18  Plan Clinically determined that he is cleared to return to work full regular duty starting on Monday 04/25/18 - without restrictions, but one recommendation is to wear the Orthopedic Shoe (velcro, open toed, support shoe firm sole) for ambulation. - Continue current treatment plan with shoe, rest, RICE, may contact if needs lasix again PRN swelling - Defer Ortho referral at this time since still improving - Will rx Silvadene cream for topical wound care - and DC neosporin - Follow-up if not improving  Meds ordered this encounter  Medications  . silver sulfADIAZINE (SILVADENE) 1 % cream    Sig: Apply 1 application topically daily. For 2-4 weeks until wound heals    Dispense:  50 g    Refill:  0    Follow up plan: Return in about 1 month (around 05/16/2018) for Annual Physical ([facility] biometric[facility] alrdy).  [name], [facility] 04/18/2018, 12:54 PM

## 2018-04-18 NOTE — Patient Instructions (Addendum)
Thank you for coming to the office today.  Continue with support shoe for up to 4 more weeks or until able to walk without it  Stop neosporin - start new topical cream silvadene  Stop Colchicine for now - less likely gout flare  Please schedule a Follow-up Appointment to: Return in about 1 month (around 05/16/2018) for Annual Physical ([facility] biometrics alrdy).  If you have any other questions or concerns, please feel free to call the office or send a message through [place]. You may also schedule an earlier appointment if necessary.  Additionally, you may be receiving a survey about your experience at our office within a few days to 1 week by e-mail or mail. We value your feedback.  [name], DO [place]

## 2018-05-11 ENCOUNTER — Telehealth: Payer: Self-pay | Admitting: Family Medicine

## 2018-05-11 ENCOUNTER — Other Ambulatory Visit: Payer: Self-pay | Admitting: Family Medicine

## 2018-05-11 DIAGNOSIS — I10 Essential (primary) hypertension: Secondary | ICD-10-CM

## 2018-05-11 NOTE — Telephone Encounter (Signed)
Pt needs a refill on amlodipine sent to [facility].  Call back [phone]

## 2018-05-27 ENCOUNTER — Encounter: Payer: Managed Care, Other (non HMO) | Admitting: Family Medicine

## 2018-05-27 ENCOUNTER — Encounter: Payer: Self-pay | Admitting: Family Medicine

## 2018-05-27 ENCOUNTER — Ambulatory Visit (INDEPENDENT_AMBULATORY_CARE_PROVIDER_SITE_OTHER): Payer: Managed Care, Other (non HMO) | Admitting: Family Medicine

## 2018-05-27 VITALS — BP 138/69 | HR 90 | Temp 98.6°F | Resp 16 | Ht 71.0 in | Wt 226.6 lb

## 2018-05-27 DIAGNOSIS — N529 Male erectile dysfunction, unspecified: Secondary | ICD-10-CM

## 2018-05-27 DIAGNOSIS — Z23 Encounter for immunization: Secondary | ICD-10-CM

## 2018-05-27 DIAGNOSIS — Z0000 Encounter for general adult medical examination without abnormal findings: Secondary | ICD-10-CM | POA: Diagnosis not present

## 2018-05-27 DIAGNOSIS — Z125 Encounter for screening for malignant neoplasm of prostate: Secondary | ICD-10-CM

## 2018-05-27 DIAGNOSIS — D75839 Thrombocytosis, unspecified: Secondary | ICD-10-CM

## 2018-05-27 DIAGNOSIS — N401 Enlarged prostate with lower urinary tract symptoms: Secondary | ICD-10-CM | POA: Insufficient documentation

## 2018-05-27 DIAGNOSIS — R3914 Feeling of incomplete bladder emptying: Secondary | ICD-10-CM

## 2018-05-27 DIAGNOSIS — E669 Obesity, unspecified: Secondary | ICD-10-CM

## 2018-05-27 DIAGNOSIS — R7303 Prediabetes: Secondary | ICD-10-CM

## 2018-05-27 DIAGNOSIS — D473 Essential (hemorrhagic) thrombocythemia: Secondary | ICD-10-CM

## 2018-05-27 DIAGNOSIS — E782 Mixed hyperlipidemia: Secondary | ICD-10-CM

## 2018-05-27 DIAGNOSIS — I10 Essential (primary) hypertension: Secondary | ICD-10-CM

## 2018-05-27 DIAGNOSIS — Z114 Encounter for screening for human immunodeficiency virus [HIV]: Secondary | ICD-10-CM

## 2018-05-27 MED ORDER — SILDENAFIL CITRATE 20 MG PO TABS
ORAL_TABLET | ORAL | 3 refills | Status: AC
Start: 2018-05-27 — End: ?

## 2018-05-27 NOTE — Patient Instructions (Addendum)
Thank you for coming to the office today.  Start back on Metformin  Increase regular activity and exercise as discussed.  Try to stay well hydrated.  Low carb low sugar diet.  Consider Keto Diet for weight loss as well.  For Prostate, likely mild enlarged prostate. I recommend trying a herbal supplement to help control your symptoms in the morning.  Start with Saw Palmetto (OTC) 80mg  twice a day, then if tolerating well can increase up to 160mg  twice daily.  Also may try double voiding in morning.  Try to request the Colonoscopy record - stay tuned, may need more information.  Discount generic Sildenafil pills $1 per [facility]   Address: [address] [place], [place] [address] Hours: 8:30AM-6:30PM Phone: [phone]   [facility] - can offer this as well.   DUE for FASTING BLOOD WORK (no food or drink after midnight before the lab appointment, only water or coffee without cream/sugar on the morning of)  [facility]  lab draw in 1 WEEK  For Lab Results, once available within 2-3 days of blood draw, you can can log in to MyChart online to view your results and a brief explanation. Also, we can discuss results at next follow-up visit.  Please schedule a Follow-up Appointment to: Return in about 3 months (around 08/26/2018) for PreDM A1c, Prostate BPH.  If you have any other questions or concerns, please feel free to call the office or send a message through [place]. You may also schedule an earlier appointment if necessary.  Additionally, you may be receiving a survey about your experience at our office within a few days to 1 week by e-mail or mail. We value your feedback.  [name], DO [place]

## 2018-05-27 NOTE — Assessment & Plan Note (Signed)
Elevated BMI >31 Actually few lb weight loss Poor diet/lifestyle  Recommend improve healthy diet low carb low sugar - can try keto diet as discussed, goal for wt loss Encourage restart regular exercise, aerobic, low impact

## 2018-05-27 NOTE — Assessment & Plan Note (Signed)
Elevated A1c up to 6.4, with poorly controlled Pre-DM - prior readings 6.0-6.2 Concern with obesity, HTN, HLD  Plan:  1. RESTART existing Metformin 500mg  daily 2. Encourage improved lifestyle - low carb, low sugar diet, reduce portion size, continue improving regular exercise 3. Follow-up q 3 months preDM A1c - discussed that if remains elevated or >6.5 at risk of new dx Diabetes

## 2018-05-27 NOTE — Assessment & Plan Note (Signed)
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 Trial on generic Sildenafil 20mg  tabs, take 1-5 tabs about 30 min prior to sexual activity, refills provided 2. Follow-up as needed

## 2018-05-27 NOTE — Assessment & Plan Note (Signed)
Consistent clinically with new diagnosis BPH, with lower urinary tract symptoms (LUTS) - AUA BPH score 5 (mild, 05/27/18) - no prior score - No prior treatment - Last PSA unknown - Last DRE years ago normal reported - No known personal/family history of prostate CA  Plan: 1. Start Saw palmetto OTC 80 to 160mg  BID trial - in future may consider alpha blocker, but relatively low and localized symptoms 2. Follow-up 3 months - may consider alpha blocker as well for BP if need

## 2018-05-27 NOTE — Assessment & Plan Note (Signed)
Stable, HTN. Seems better controlled - Still poor lifestyle habits (poor diet / no regular exercise - limited) - No known complications, asymptomatic - Concern for possible secondary etiology for HTN (still consider OSA), given refractory to 3 medications, seems to be adhering to therapy - Also considered possible hyperaldo or endocrine etiology for secondary HTN - seems less likely if better controlled now - Again remains due for OSA evaluation PSG  Plan: 1. Continue Metoprolol XL 25mg  daily, Amlodipine 10, HCTZ 25mg , Lisinopril 40 - may consider Arlyce Harman if needed in future 2. Emphasized importance of home BP monitoring, keep hand written log, check daily, bring to next visit 3. Recommend improve lifestyle recommendations adding aerobic exercise - low impact  Check labs upcoming next week with chemistry

## 2018-05-27 NOTE — Assessment & Plan Note (Signed)
Uncontrolled cholesterol poor lifestyle Last lipid panel 02/2018 Calculated ASCVD 10 yr risk score >11% up to 20% based on uncontrolled HTN  Plan: 1. Continue ASA 81mg  for primary ASCVD risk reduction 2. Again counseling on reduce ASCVD risk - recommendation for statin, he is not quite ready, will re-consider 3. Encourage improved lifestyle - low carb/cholesterol, reduce portion size, continue improving regular exercise

## 2018-05-27 NOTE — Assessment & Plan Note (Signed)
Stable chronic problem Due for re-check CBC with upcoming labs for trend Plt May return to Hematology in future if worsening or new concerns.

## 2018-05-27 NOTE — Progress Notes (Addendum)
Subjective:    Patient ID: Darrell Abu Sr., male    DOB: [birth year], 62 y.o.   MRN: [id number]  Darrell Coye Lofgren Sr. is a 62 y.o. male presenting on 05/27/2018 for Annual Exam   HPI   Here for Annual Physical and Lab Review (biometrics from [employer], he will be due for additional labs next week)  Pre-Diabetes / Obesity BMI >31 Reports concerns with elevated sugar, he has had prior A1c 6.0 to 6.2. Now recent biometric shows A1c 6.4 CBGs: Not checking sugar Meds: Metformin 500mg  daily - NOT TAKING - he has stopped this for a while, unsure why, he tolerated it well. Currently on ACEi Lifestyle: - Diet (not adhering to low carb diet, he plans to restart healthier diet)  - Exercise (limited activity, now more sedentary after prior injury and fall, he no longer does sit up push up regimen) Denies hypoglycemia  HTN Continues to take current medications. BP has been controlled No concerns.  HYPERLIPIDEMIA: - Reports no concern. Last lipid panel 02/2018 from work biometric, elevated LDL still, lower HDL - Not on statin.  Mild Enlarged BPH with LUTS / Erectile Dysfunction Reports relatively newer concerns, not addressed before. Describes some LUTS mostly incomplete bladder emptying and some urinary frequency in the AM. See AUA BPH score below. No prior treatment for this in past. Never on Flomax. Never seen Urology. Prior PCP did DRE in past unremarkable by his report. No prior PSA - Additionally with Erectile Dysfunction by his report, seems to have reduced sexual function with reduced erections, this is longer term problem tried temporary low dose of Sildenafil in past, but agrees to try again.  AUA BPH Symptom Score over past 1 month 1. Sensation of not emptying bladder post void - 1 2. Urinate less than 2 hour after finish last void - 2 3. Start/Stop several times during void - 0 4. Difficult to postpone urination - 2 5. Weak urinary stream - 0 6. Push or strain urination  - 0 7. Nocturia - 0 times  Total Score: 5 (Mild BPH symptoms)  Thrombocytosis Prior elevated plt, chronic problem. Previously followed by Hematology, no longer. Due for lab will check today. Denies concerns.  Health Maintenance:  Due routine HIV screen lab, ordered.  Due for Flu Shot, will receive today   Prostate CA Screening: No prior prostate CA screening did have DRE reported normal few years ago by prior PCP, No known PSA on file. Currently very mild BPH LUTS. No known family history of prostate CA. Due for screening.  Colon CA Screening: Last Colonoscopy reported to be done by [facility] in [place] in 2014, results with reported no polyps or benign exam, good for 5+ years by his report, he does not have copy. Currently asymptomatic. No known family history of colon CA. Will request copy of records for update chart  **UPDATED CHART after visit - received copy of colonoscopy report, done 09/28/12, no polyps, recommended return in 5 years for repeat - called patient, we agree to schedule it in 2020, will place order next year for repeat colonoscopy, will determine where patient wishes to go for testing at that time** - original report to be scanned.   Depression screen [facility] 2/9 05/27/2018 04/18/2018 04/06/2018  Decreased Interest 0 0 0  Down, Depressed, Hopeless 0 0 0  PHQ - 2 Score 0 0 0    Past Medical History:  Diagnosis Date  . Allergy   . Gout   .  Thrombocytosis ([place])    No past surgical history on file. Social History   Socioeconomic History  . Marital status: Married    Spouse name: Not on file  . Number of children: Not on file  . Years of education: Not on file  . Highest education level: Not on file  Occupational History  . Not on file  Social Needs  . Financial resource strain: Not on file  . Food insecurity:    Worry: Not on file    Inability: Not on file  . Transportation needs:    Medical: Not on file    Non-medical: Not on file  Tobacco Use  .  Smoking status: Never Smoker  . Smokeless tobacco: Never Used  Substance and Sexual Activity  . Alcohol use: Yes  . Drug use: No  . Sexual activity: Not on file  Lifestyle  . Physical activity:    Days per week: Not on file    Minutes per session: Not on file  . Stress: Not on file  Relationships  . Social connections:    Talks on phone: Not on file    Gets together: Not on file    Attends religious service: Not on file    Active member of club or organization: Not on file    Attends meetings of clubs or organizations: Not on file    Relationship status: Not on file  . Intimate partner violence:    Fear of current or ex partner: Not on file    Emotionally abused: Not on file    Physically abused: Not on file    Forced sexual activity: Not on file  Other Topics Concern  . Not on file  Social History Narrative  . Not on file   Family History  Problem Relation Age of Onset  . Diabetes Mother   . Prostate cancer Neg Hx   . Colon cancer Neg Hx    Current Outpatient Medications on File Prior to Visit  Medication Sig  . amLODipine (NORVASC) 10 MG tablet TAKE 1 TABLET BY MOUTH  DAILY FOR BLOOD PRESSURE  . aspirin EC 81 MG tablet Take 1 tablet (81 mg total) by mouth daily.  . baclofen (LIORESAL) 10 MG tablet Take 0.5-1 tablets (5-10 mg total) by mouth at bedtime.  . colchicine 0.6 MG tablet Take 1 tablet (0.6 mg total) by mouth daily.  Water engineer Bandages & Supports (THUMB SPLINT/RIGHT LARGE) MISC 1 each by Does not apply route daily.  . furosemide (LASIX) 20 MG tablet Take 1 tablet (20 mg total) by mouth daily. For 3-5 days, may take 2 in a day if no improvement  . hydrochlorothiazide (HYDRODIURIL) 25 MG tablet Take 1 tablet (25 mg total) by mouth daily.  Marland Kitchen lisinopril (PRINIVIL,ZESTRIL) 40 MG tablet Take 1 tablet (40 mg total) by mouth daily.  . metFORMIN (GLUCOPHAGE) 500 MG tablet Take 1 tablet (500 mg total) by mouth daily with breakfast.  . metoprolol succinate (TOPROL-XL) 25  MG 24 hr tablet Take 1 tablet (25 mg total) by mouth daily.  . naproxen (NAPROSYN) 500 MG tablet Take 1 tablet (500 mg total) by mouth 2 (two) times daily with a meal. For 2-4 weeks then as needed  . ONE TOUCH ULTRA TEST test strip   . ONETOUCH DELICA LANCETS 87O MISC   . silver sulfADIAZINE (SILVADENE) 1 % cream Apply 1 application topically daily. For 2-4 weeks until wound heals  . terbinafine (LAMISIL AT) 1 % cream Apply 1 application topically  2 (two) times daily.   No current facility-administered medications on file prior to visit.     Review of Systems  Constitutional: Negative for activity change, appetite change, chills, diaphoresis, fatigue and fever.  HENT: Negative for congestion and hearing loss.   Eyes: Negative for visual disturbance.  Respiratory: Negative for apnea, cough, chest tightness, shortness of breath and wheezing.   Cardiovascular: Negative for chest pain, palpitations and leg swelling.  Gastrointestinal: Negative for abdominal pain, anal bleeding, blood in stool, constipation, diarrhea, nausea and vomiting.  Endocrine: Negative for cold intolerance.  Genitourinary: Positive for frequency. Negative for decreased urine volume, difficulty urinating, dysuria, hematuria, testicular pain and urgency.       Incomplete bladder emptying  Musculoskeletal: Negative for arthralgias, back pain and neck pain.  Skin: Negative for rash.  Allergic/Immunologic: Negative for environmental allergies.  Neurological: Negative for dizziness, weakness, light-headedness, numbness and headaches.  Hematological: Negative for adenopathy.  Psychiatric/Behavioral: Negative for behavioral problems, dysphoric mood and sleep disturbance. The patient is not nervous/anxious.    Per HPI unless specifically indicated above     Objective:    BP 138/69   Pulse 90   Temp 98.6 F (37 C) (Oral)   Resp 16   Ht 5\' 11"  (1.803 m)   Wt 226 lb 9.6 oz (102.8 kg)   BMI 31.60 kg/m   Wt Readings from  Last 3 Encounters:  05/27/18 226 lb 9.6 oz (102.8 kg)  04/18/18 228 lb 9.6 oz (103.7 kg)  04/06/18 229 lb (103.9 kg)    Physical Exam  Constitutional: He is oriented to person, place, and time. He appears well-developed and well-nourished. No distress.  Well-appearing, comfortable, cooperative  HENT:  Head: Normocephalic and atraumatic.  Mouth/Throat: Oropharynx is clear and moist.  Eyes: Pupils are equal, round, and reactive to light. Conjunctivae and EOM are normal. Right eye exhibits no discharge. Left eye exhibits no discharge.  Neck: Normal range of motion. Neck supple. No thyromegaly present.  Cardiovascular: Normal rate, regular rhythm, normal heart sounds and intact distal pulses.  No murmur heard. Pulmonary/Chest: Effort normal and breath sounds normal. No respiratory distress. He has no wheezes. He has no rales.  Abdominal: Soft. Bowel sounds are normal. He exhibits no distension and no mass. There is no tenderness.  Musculoskeletal: Normal range of motion. He exhibits no edema or tenderness.  Upper / Lower Extremities: - Normal muscle tone, strength bilateral upper extremities 5/5, lower extremities 5/5  Lymphadenopathy:    He has no cervical adenopathy.  Neurological: He is alert and oriented to person, place, and time.  Distal sensation intact to light touch all extremities  Skin: Skin is warm and dry. No rash noted. He is not diaphoretic. No erythema.  Psychiatric: He has a normal mood and affect. His behavior is normal.  Well groomed, good eye contact, normal speech and thoughts  Nursing note and vitals reviewed.    Results for orders placed or performed in visit on 35/70/17  Basic metabolic panel  Result Value Ref Range   Glucose 136   Lipid panel  Result Value Ref Range   Triglycerides 138 40 - 160   Cholesterol 231 (A) 0 - 200   HDL 58 35 - 70   LDL Cholesterol 145   Hemoglobin A1c  Result Value Ref Range   Hgb A1c MFr Bld 6.4 (A) 4.0 - 6.0        Assessment & Plan:   Problem List Items Addressed This Visit    Benign  prostatic hyperplasia with incomplete bladder emptying    Consistent clinically with new diagnosis BPH, with lower urinary tract symptoms (LUTS) - AUA BPH score 5 (mild, 05/27/18) - no prior score - No prior treatment - Last PSA unknown - Last DRE years ago normal reported - No known personal/family history of prostate CA  Plan: 1. Start Saw palmetto OTC 80 to 160mg  BID trial - in future may consider alpha blocker, but relatively low and localized symptoms 2. Follow-up 3 months - may consider alpha blocker as well for BP if need      Relevant Orders   PSA   Erectile dysfunction    Consistent with likely age-related vs vascular ED due to HTN, DM, HLD Also some BPH symptoms  Plan: 1. Trial on generic Sildenafil 20mg  tabs, take 1-5 tabs about 30 min prior to sexual activity, refills provided 2. Follow-up as needed      Relevant Medications   sildenafil (REVATIO) 20 MG tablet   Hyperlipidemia    Uncontrolled cholesterol poor lifestyle Last lipid panel 02/2018 Calculated ASCVD 10 yr risk score >11% up to 20% based on uncontrolled HTN  Plan: 1. Continue ASA 81mg  for primary ASCVD risk reduction 2. Again counseling on reduce ASCVD risk - recommendation for statin, he is not quite ready, will re-consider 3. Encourage improved lifestyle - low carb/cholesterol, reduce portion size, continue improving regular exercise      Relevant Medications   sildenafil (REVATIO) 20 MG tablet   Obesity (BMI 30.0-34.9)    Elevated BMI >31 Actually few lb weight loss Poor diet/lifestyle  Recommend improve healthy diet low carb low sugar - can try keto diet as discussed, goal for wt loss Encourage restart regular exercise, aerobic, low impact      Pre-diabetes    Elevated A1c up to 6.4, with poorly controlled Pre-DM - prior readings 6.0-6.2 Concern with obesity, HTN, HLD  Plan:  1. RESTART existing Metformin 500mg   daily 2. Encourage improved lifestyle - low carb, low sugar diet, reduce portion size, continue improving regular exercise 3. Follow-up q 3 months preDM A1c - discussed that if remains elevated or >6.5 at risk of new dx Diabetes      Resistant hypertension    Stable, HTN. Seems better controlled - Still poor lifestyle habits (poor diet / no regular exercise - limited) - No known complications, asymptomatic - Concern for possible secondary etiology for HTN (still consider OSA), given refractory to 3 medications, seems to be adhering to therapy - Also considered possible hyperaldo or endocrine etiology for secondary HTN - seems less likely if better controlled now - Again remains due for OSA evaluation PSG  Plan: 1. Continue Metoprolol XL 25mg  daily, Amlodipine 10, HCTZ 25mg , Lisinopril 40 - may consider Arlyce Harman if needed in future 2. Emphasized importance of home BP monitoring, keep hand written log, check daily, bring to next visit 3. Recommend improve lifestyle recommendations adding aerobic exercise - low impact  Check labs upcoming next week with chemistry      Relevant Medications   sildenafil (REVATIO) 20 MG tablet   Other Relevant Orders   Comprehensive metabolic panel   Thrombocytosis (HCC)    Stable chronic problem Due for re-check CBC with upcoming labs for trend Plt May return to Hematology in future if worsening or new concerns.      Relevant Orders   CBC with Differential/Platelet    Other Visit Diagnoses    Annual physical exam    -  Primary   Relevant  Orders   CBC with Differential/Platelet   Comprehensive metabolic panel   Needs flu shot       Relevant Orders   Flu Vaccine QUAD 36+ mos IM (Completed)   Screening for prostate cancer       Screening for HIV (human immunodeficiency virus)       Relevant Orders   HIV antibody      Updated Health Maintenance information - Request colonoscopy record from 2015 Reviewed recent lab results with  patient Encouraged improvement to lifestyle with diet and exercise - Goal of weight loss   Meds ordered this encounter  Medications  . sildenafil (REVATIO) 20 MG tablet    Sig: Take 1-5 pills about 30 min prior to sex. Start with 1 and increase as needed.    Dispense:  30 tablet    Refill:  3    Follow up plan: Return in about 3 months (around 08/26/2018) for PreDM A1c, Prostate BPH.  Completed form for work biometric screening, signed off today with alternative diet plan and lifestyle recommendation to improve BMI, goal < 30  Patient was given printed [facility] orders today to be drawn next week for remaining fasting physical labs. He has completed biometric through employer already, which has been abstracted into chart today.  [name], [facility] 05/27/2018, 10:36 AM

## 2018-05-31 LAB — COMPREHENSIVE METABOLIC PANEL
A/G RATIO: 1.5 (ref 1.2–2.2)
ALT: 22 IU/L (ref 0–44)
AST: 16 IU/L (ref 0–40)
Albumin: 4.3 g/dL (ref 3.6–4.8)
Alkaline Phosphatase: 49 IU/L (ref 39–117)
BILIRUBIN TOTAL: 0.5 mg/dL (ref 0.0–1.2)
BUN/Creatinine Ratio: 12 (ref 10–24)
BUN: 11 mg/dL (ref 8–27)
CHLORIDE: 101 mmol/L (ref 96–106)
CO2: 25 mmol/L (ref 20–29)
Calcium: 9.9 mg/dL (ref 8.6–10.2)
Creatinine, Ser: 0.95 mg/dL (ref 0.76–1.27)
GFR calc Af Amer: 100 mL/min/{1.73_m2} (ref >59)
GFR calc non Af Amer: 87 mL/min/{1.73_m2} (ref >59)
GLOBULIN, TOTAL: 2.8 g/dL (ref 1.5–4.5)
Glucose: 136 mg/dL — ABNORMAL HIGH (ref 65–99)
POTASSIUM: 4 mmol/L (ref 3.5–5.2)
SODIUM: 142 mmol/L (ref 134–144)
TOTAL PROTEIN: 7.1 g/dL (ref 6.0–8.5)

## 2018-05-31 LAB — CBC WITH DIFFERENTIAL/PLATELET
BASOS ABS: 0.1 10*3/uL (ref 0.0–0.2)
Basos: 1 %
EOS (ABSOLUTE): 0.4 10*3/uL (ref 0.0–0.4)
Eos: 9 %
HEMATOCRIT: 41.1 % (ref 37.5–51.0)
Hemoglobin: 14 g/dL (ref 13.0–17.7)
Immature Grans (Abs): 0 10*3/uL (ref 0.0–0.1)
Immature Granulocytes: 0 %
LYMPHS ABS: 1.9 10*3/uL (ref 0.7–3.1)
Lymphs: 44 %
MCH: 28.9 pg (ref 26.6–33.0)
MCHC: 34.1 g/dL (ref 31.5–35.7)
MCV: 85 fL (ref 79–97)
MONOS ABS: 0.4 10*3/uL (ref 0.1–0.9)
Monocytes: 10 %
Neutrophils Absolute: 1.6 10*3/uL (ref 1.4–7.0)
Neutrophils: 36 %
Platelets: 488 10*3/uL — ABNORMAL HIGH (ref 150–450)
RBC: 4.85 x10E6/uL (ref 4.14–5.80)
RDW: 13.5 % (ref 12.3–15.4)
WBC: 4.4 10*3/uL (ref 3.4–10.8)

## 2018-05-31 LAB — PSA: PROSTATE SPECIFIC AG, SERUM: 1.4 ng/mL (ref 0.0–4.0)

## 2018-05-31 LAB — HIV ANTIBODY (ROUTINE TESTING W REFLEX): HIV SCREEN 4TH GENERATION: NONREACTIVE

## 2018-06-27 ENCOUNTER — Telehealth: Payer: Self-pay | Admitting: Family Medicine

## 2018-06-27 ENCOUNTER — Other Ambulatory Visit: Payer: Self-pay

## 2018-06-27 DIAGNOSIS — M1712 Unilateral primary osteoarthritis, left knee: Secondary | ICD-10-CM

## 2018-06-27 DIAGNOSIS — G8929 Other chronic pain: Secondary | ICD-10-CM

## 2018-06-27 DIAGNOSIS — M25562 Pain in left knee: Principal | ICD-10-CM

## 2018-06-27 MED ORDER — NAPROXEN 500 MG PO TABS: 500.0000 mg | ORAL_TABLET | Freq: Two times a day (BID) | ORAL | 1 refills | Status: DC

## 2018-06-27 NOTE — Telephone Encounter (Signed)
Sent rx naproxen  [name], DO [facility] 06/27/2018, 6:16 PM

## 2018-06-27 NOTE — Telephone Encounter (Signed)
Pt needs a refill on naproxen sent to [facility]

## 2018-06-29 ENCOUNTER — Other Ambulatory Visit: Payer: Self-pay | Admitting: Family Medicine

## 2018-06-29 DIAGNOSIS — I10 Essential (primary) hypertension: Secondary | ICD-10-CM

## 2018-08-09 ENCOUNTER — Other Ambulatory Visit: Payer: Self-pay | Admitting: Family Medicine

## 2018-08-09 DIAGNOSIS — I10 Essential (primary) hypertension: Secondary | ICD-10-CM

## 2018-08-24 ENCOUNTER — Ambulatory Visit: Payer: Managed Care, Other (non HMO) | Admitting: Sports Medicine

## 2018-08-24 ENCOUNTER — Telehealth: Payer: Self-pay | Admitting: *Deleted

## 2018-08-24 ENCOUNTER — Ambulatory Visit (INDEPENDENT_AMBULATORY_CARE_PROVIDER_SITE_OTHER): Payer: Managed Care, Other (non HMO)

## 2018-08-24 ENCOUNTER — Encounter: Payer: Self-pay | Admitting: Sports Medicine

## 2018-08-24 VITALS — BP 163/85 | HR 65 | Resp 16 | Ht 70.0 in | Wt 222.0 lb

## 2018-08-24 DIAGNOSIS — M779 Enthesopathy, unspecified: Secondary | ICD-10-CM | POA: Diagnosis not present

## 2018-08-24 DIAGNOSIS — M7752 Other enthesopathy of left foot: Secondary | ICD-10-CM

## 2018-08-24 DIAGNOSIS — M2550 Pain in unspecified joint: Secondary | ICD-10-CM

## 2018-08-24 DIAGNOSIS — M19072 Primary osteoarthritis, left ankle and foot: Secondary | ICD-10-CM | POA: Diagnosis not present

## 2018-08-24 DIAGNOSIS — M79672 Pain in left foot: Secondary | ICD-10-CM

## 2018-08-24 DIAGNOSIS — M778 Other enthesopathies, not elsewhere classified: Secondary | ICD-10-CM

## 2018-08-24 DIAGNOSIS — S99922A Unspecified injury of left foot, initial encounter: Secondary | ICD-10-CM

## 2018-08-24 DIAGNOSIS — Z8739 Personal history of other diseases of the musculoskeletal system and connective tissue: Secondary | ICD-10-CM

## 2018-08-24 MED ORDER — MELOXICAM 15 MG PO TABS: 15.0000 mg | ORAL_TABLET | Freq: Every day | ORAL | 0 refills | Status: DC

## 2018-08-24 NOTE — Progress Notes (Signed)
Subjective: Darrell CARBONELL Sr. is a 62 y.o. male patient who presents to office for evaluation of left foot pain. Patient complains of progressive pain since falling off of the second to the bottom step on a ladder on July 4.  Patient reports that his feet went underneath the ladder and he fell backwards with his left foot being most caught underneath the ladder and every since he has had pain and swelling reports that the pain is a throbbing and aching sensation especially over the left great toe joint and left ankle states that he has been treated by another doctor who prescribed pain medicine and prescribed a postoperative shoe and a cam boot and states that it has not helped.  Patient states that he has also tried a Garment/textile technologist and Epson salt soaks with no additional relief patient states that he also has a scar over the top of the ankle that developed from his fall that has healed but he does not like the way that it looks.  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  Patient denies nausea vomiting fever chills or any other additional constitutional symptoms at this time.  Review of Systems  Musculoskeletal: Positive for joint pain and myalgias.       Joint swelling Gait problem  All other systems reviewed and are negative.    Patient Active Problem List   Diagnosis Date Noted  . Benign prostatic hyperplasia with incomplete bladder emptying 05/27/2018  . Suspected sleep apnea 05/19/2017  . Thrombocytosis ([place]) 09/07/2016  . Hyperlipidemia 09/07/2016  . Erectile dysfunction 09/07/2016  . Obesity (BMI 30.0-34.9) 06/24/2016  . Osteoarthritis of left knee 06/24/2016  . Chronic pain of left knee 06/24/2016  . Exposure to hepatitis C 10/24/2015  . Tinea pedis 09/03/2015  . Resistant hypertension 07/10/2015  . Pre-diabetes 07/10/2015    Current Outpatient Medications on File Prior to Visit  Medication Sig Dispense Refill  .  amLODipine (NORVASC) 10 MG tablet TAKE 1 TABLET BY MOUTH  DAILY FOR BLOOD PRESSURE 90 tablet 3  . aspirin EC 81 MG tablet Take 1 tablet (81 mg total) by mouth daily.    . baclofen (LIORESAL) 10 MG tablet Take 0.5-1 tablets (5-10 mg total) by mouth at bedtime. 30 each 2  . colchicine 0.6 MG tablet Take 1 tablet (0.6 mg total) by mouth daily. 90 tablet 3  . Elastic Bandages & Supports (THUMB SPLINT/RIGHT LARGE) MISC 1 each by Does not apply route daily. 1 each 0  . furosemide (LASIX) 20 MG tablet Take 1 tablet (20 mg total) by mouth daily. For 3-5 days, may take 2 in a day if no improvement 10 tablet 0  . hydrochlorothiazide (HYDRODIURIL) 25 MG tablet Take 1 tablet (25 mg total) by mouth daily. 90 tablet 3  . lisinopril (PRINIVIL,ZESTRIL) 40 MG tablet TAKE 1 TABLET BY MOUTH  DAILY 90 tablet 3  . metFORMIN (GLUCOPHAGE) 500 MG tablet Take 1 tablet (500 mg total) by mouth daily with breakfast. 90 tablet 3  . metoprolol succinate (TOPROL-XL) 25 MG 24 hr tablet Take 1 tablet (25 mg total) by mouth daily. 90 tablet 3  . naproxen (NAPROSYN) 500 MG tablet Take 1 tablet (500 mg total) by mouth 2 (two) times daily with a meal. For 2-4 weeks then as needed 180 tablet 1  . ONE TOUCH ULTRA TEST test strip     . ONETOUCH DELICA LANCETS 45Y MISC     .  sildenafil (REVATIO) 20 MG tablet Take 1-5 pills about 30 min prior to sex. Start with 1 and increase as needed. 30 tablet 3  . silver sulfADIAZINE (SILVADENE) 1 % cream Apply 1 application topically daily. For 2-4 weeks until wound heals 50 g 0  . terbinafine (LAMISIL AT) 1 % cream Apply 1 application topically 2 (two) times daily. 30 g 12   No current facility-administered medications on file prior to visit.     No Known Allergies  Objective:  General: Alert and oriented x3 in no acute distress  Dermatology: Scar anterior ankle on left that is well-healed with keloid formation.  No open lesions bilateral lower extremities, no webspace macerations, no  ecchymosis bilateral, all nails x 10 are well manicured.  Vascular: Dorsalis Pedis and Posterior Tibial pedal pulses palpable, Capillary Fill Time 3 seconds,(+) pedal hair growth bilateral, no significant edema bilateral lower extremities but subjectively patient states that he has swelling on the left foot, Temperature gradient within normal limits.  Neurology: Johney Maine sensation intact via light touch bilateral.  Musculoskeletal: Mild tenderness with palpation at first metatarsophalangeal joint on left but no pain with range of motion there is pain to palpation at the anterior ankle at the area of scar but no pain with range of motion,No pain with calf compression bilateral. There is bunion formation and pes planus foot type bilateral left greater than right.  Gait: Antalgic gait postop shoe assisted  Xrays  Left foot   Impression: Diffuse arthritis no fracture or dislocation, increased intermetatarsal angle supportive of bunion deformity, mild forefoot soft tissue swelling.  No other acute findings.  Assessment and Plan: Problem List Items Addressed This Visit    None    Visit Diagnoses    Left foot pain    -  Primary   Relevant Medications   meloxicam (MOBIC) 15 MG tablet   Other Relevant Orders   DG Foot Complete Left   Capsulitis of left ankle       Relevant Medications   meloxicam (MOBIC) 15 MG tablet   Capsulitis of left foot       Relevant Medications   meloxicam (MOBIC) 15 MG tablet   Arthritis of left foot       Relevant Medications   meloxicam (MOBIC) 15 MG tablet   Arthralgia, unspecified joint       Relevant Orders   Uric Acid   CBC with Differential   Basic Metabolic Panel   Sedimentation Rate   ANA,IFA RA Diag Pnl w/rflx Tit/Patn   HLA-B27 Antigen   C-reactive protein   History of gout       Injury, foot, left, initial encounter           -Complete examination performed -Xrays reviewed -Discussed treatement options for likely capsulitis versus  longstanding gout versus arthritis secondary to trauma with continued pain -Rx MRI for further evaluation in the setting of injury to rule out any additional soft tendon joint ligament or tendon issue since x-rays are negative for fracture -Ordered arthritic panel to see if there is any other underlying conditions that could be adding to patient's continued pain -Prescribed meloxicam to take as instructed -Dispensed surgitube compression sleeve to use to assist with edema control and gave patient a brochure for elastic therapy -May continue with postop shoe until MRI has been completed -Patient to return to office after MRI and blood work tests are done.  We will call patient with results and schedule follow-up accordingly or sooner if condition  worsens.  Landis Martins, DPM

## 2018-08-24 NOTE — Telephone Encounter (Signed)
Left message for pt to call with location preferred for MRI.

## 2018-08-24 NOTE — Progress Notes (Signed)
   Subjective:    Patient ID: [name], male    DOB: 1956/06/05, 62 y.o.   MRN: [id number]  HPI    Review of Systems  Musculoskeletal: Positive for arthralgias, gait problem, joint swelling and myalgias.       Objective:   Physical Exam        Assessment & Plan:

## 2018-08-24 NOTE — Telephone Encounter (Signed)
Pt request MRIs in [place].

## 2018-08-24 NOTE — Telephone Encounter (Signed)
Orders to J. Quintana, RN for pre-cert. 

## 2018-08-24 NOTE — Telephone Encounter (Signed)
-----   Message from [place], [place] sent at 08/24/2018  2:11 PM EST ----- Regarding: MRI L foot and ankle Pain since July with swelling after falling off ladder at big toe joint and ankle

## 2018-08-24 NOTE — Telephone Encounter (Signed)
Patient returned Val's call would like MRI done here in [place]

## 2018-08-29 LAB — CBC WITH DIFFERENTIAL/PLATELET
BASOS ABS: 0.1 10*3/uL (ref 0.0–0.2)
Basos: 2 %
EOS (ABSOLUTE): 0.4 10*3/uL (ref 0.0–0.4)
Eos: 10 %
Hematocrit: 44.1 % (ref 37.5–51.0)
Hemoglobin: 14.3 g/dL (ref 13.0–17.7)
IMMATURE GRANS (ABS): 0 10*3/uL (ref 0.0–0.1)
IMMATURE GRANULOCYTES: 0 %
LYMPHS: 50 %
Lymphocytes Absolute: 2.1 10*3/uL (ref 0.7–3.1)
MCH: 28.1 pg (ref 26.6–33.0)
MCHC: 32.4 g/dL (ref 31.5–35.7)
MCV: 87 fL (ref 79–97)
Monocytes Absolute: 0.3 10*3/uL (ref 0.1–0.9)
Monocytes: 7 %
Neutrophils Absolute: 1.3 10*3/uL — ABNORMAL LOW (ref 1.4–7.0)
Neutrophils: 31 %
PLATELETS: 470 10*3/uL — AB (ref 150–450)
RBC: 5.08 x10E6/uL (ref 4.14–5.80)
RDW: 13.1 % (ref 12.3–15.4)
WBC: 4.2 10*3/uL (ref 3.4–10.8)

## 2018-08-29 LAB — BASIC METABOLIC PANEL
BUN / CREAT RATIO: 10 (ref 10–24)
BUN: 9 mg/dL (ref 8–27)
CALCIUM: 9.7 mg/dL (ref 8.6–10.2)
CO2: 26 mmol/L (ref 20–29)
Chloride: 100 mmol/L (ref 96–106)
Creatinine, Ser: 0.86 mg/dL (ref 0.76–1.27)
GFR, EST AFRICAN AMERICAN: 108 mL/min/{1.73_m2} (ref >59)
GFR, EST NON AFRICAN AMERICAN: 94 mL/min/{1.73_m2} (ref >59)
GLUCOSE: 166 mg/dL — AB (ref 65–99)
POTASSIUM: 4.2 mmol/L (ref 3.5–5.2)
Sodium: 142 mmol/L (ref 134–144)

## 2018-08-29 LAB — SEDIMENTATION RATE: Sed Rate: 6 mm/hr (ref 0–30)

## 2018-08-29 LAB — ANA,IFA RA DIAG PNL W/RFLX TIT/PATN
ANA Titer 1: NEGATIVE
Cyclic Citrullin Peptide Ab: 23 units — ABNORMAL HIGH (ref 0–19)

## 2018-08-29 LAB — URIC ACID: URIC ACID: 8 mg/dL (ref 3.7–8.6)

## 2018-08-29 LAB — C-REACTIVE PROTEIN: CRP: 5 mg/L (ref 0–10)

## 2018-08-29 LAB — HLA-B27 ANTIGEN: HLA B27: NEGATIVE

## 2018-09-01 ENCOUNTER — Other Ambulatory Visit: Payer: Self-pay | Admitting: Sports Medicine

## 2018-09-01 DIAGNOSIS — M79672 Pain in left foot: Secondary | ICD-10-CM

## 2018-09-01 DIAGNOSIS — M7752 Other enthesopathy of left foot: Secondary | ICD-10-CM

## 2018-09-01 DIAGNOSIS — M19072 Primary osteoarthritis, left ankle and foot: Secondary | ICD-10-CM

## 2018-09-02 ENCOUNTER — Telehealth: Payer: Self-pay | Admitting: *Deleted

## 2018-09-02 DIAGNOSIS — M79672 Pain in left foot: Secondary | ICD-10-CM

## 2018-09-02 DIAGNOSIS — Z8739 Personal history of other diseases of the musculoskeletal system and connective tissue: Secondary | ICD-10-CM

## 2018-09-02 DIAGNOSIS — M2550 Pain in unspecified joint: Secondary | ICD-10-CM

## 2018-09-02 NOTE — Telephone Encounter (Signed)
-----   Message from [place], [place] sent at 08/30/2018  7:30 AM EST ----- Please let patient know that his ANA factor came back positive which could mean that there is some underlying inflammatory condition that is currently causing his pain. Recommend at this time for patient to see Rheumatology to see if they can help [place] figure out what type of inflammatory process may be going on beyond his history of gout. Thanks Dr. Cannon Kettle

## 2018-09-02 NOTE — Telephone Encounter (Signed)
Unable to leave message on home phone, no answering service available. Left message on pt's mobile phone to call for results.

## 2018-09-02 NOTE — Telephone Encounter (Signed)
Pt called for results and instructions. I informed pt of Dr. Leeanne Rio review of results and orders. Pt states he would like to be seen in [place]. I informed pt of [place] address. Faxed referral, clinicals and demographics to [facility] - Rheumatology.

## 2018-09-09 NOTE — Telephone Encounter (Signed)
I informed [facility] canceled the 09/15/2018 appts.

## 2018-09-09 NOTE — Telephone Encounter (Signed)
[facility] scheduled pt for left ankle and foot 09/15/2018 arrive at 2:00pm imaging 2:15pm. Faxed to [place].

## 2018-09-09 NOTE — Telephone Encounter (Signed)
I called pt to inform of the MRI appts and pt states cancel it, because he was to be seen by Rheumatology. I told pt that was true but Dr. Cannon Kettle also wanted him to have the MRIs to check the structures of his feet in more detail. Pt states just cancel that because the Rheumatology in [facility] couldn't see him before 9:00am and he had to be at work 10:00am. I told pt that I could reschedule him to Rheumatology in [place]. Pt states no that would not be necessary the lady said she could schedule him in January and maybe he could take some time off. I started to give pt the MRI appt times and he stated just cancel those and he would go to the Rheumatologist. I told pt the MRIs had been approved and he may not be approved again. Pt states that is fine just cancel the appt.

## 2018-09-09 NOTE — Telephone Encounter (Signed)
Dr. Cannon Kettle states pt may benefit from seeing the Rheumatologist without the MRIs, pt is to be followed by the Rhematologist.

## 2018-12-09 LAB — HM DIABETES EYE EXAM

## 2019-07-03 ENCOUNTER — Other Ambulatory Visit: Payer: Self-pay | Admitting: Family Medicine

## 2019-07-03 DIAGNOSIS — I10 Essential (primary) hypertension: Secondary | ICD-10-CM

## 2019-08-21 ENCOUNTER — Other Ambulatory Visit: Payer: Self-pay | Admitting: Family Medicine

## 2019-08-21 DIAGNOSIS — I10 Essential (primary) hypertension: Secondary | ICD-10-CM

## 2019-08-22 ENCOUNTER — Other Ambulatory Visit: Payer: Self-pay | Admitting: Nurse Practitioner

## 2019-08-22 DIAGNOSIS — I10 Essential (primary) hypertension: Secondary | ICD-10-CM

## 2019-11-28 ENCOUNTER — Ambulatory Visit (INDEPENDENT_AMBULATORY_CARE_PROVIDER_SITE_OTHER): Payer: Managed Care, Other (non HMO) | Admitting: Family Medicine

## 2019-11-28 ENCOUNTER — Encounter: Payer: Self-pay | Admitting: Family Medicine

## 2019-11-28 ENCOUNTER — Other Ambulatory Visit: Payer: Self-pay

## 2019-11-28 ENCOUNTER — Other Ambulatory Visit: Payer: Self-pay | Admitting: Family Medicine

## 2019-11-28 VITALS — BP 158/80 | HR 88 | Temp 97.6°F | Resp 16 | Ht 71.0 in | Wt 232.0 lb

## 2019-11-28 DIAGNOSIS — L2389 Allergic contact dermatitis due to other agents: Secondary | ICD-10-CM | POA: Diagnosis not present

## 2019-11-28 DIAGNOSIS — D473 Essential (hemorrhagic) thrombocythemia: Secondary | ICD-10-CM

## 2019-11-28 DIAGNOSIS — E782 Mixed hyperlipidemia: Secondary | ICD-10-CM

## 2019-11-28 DIAGNOSIS — Z0000 Encounter for general adult medical examination without abnormal findings: Secondary | ICD-10-CM

## 2019-11-28 DIAGNOSIS — I10 Essential (primary) hypertension: Secondary | ICD-10-CM

## 2019-11-28 DIAGNOSIS — D75839 Thrombocytosis, unspecified: Secondary | ICD-10-CM

## 2019-11-28 DIAGNOSIS — Z8739 Personal history of other diseases of the musculoskeletal system and connective tissue: Secondary | ICD-10-CM

## 2019-11-28 DIAGNOSIS — N401 Enlarged prostate with lower urinary tract symptoms: Secondary | ICD-10-CM

## 2019-11-28 DIAGNOSIS — R7303 Prediabetes: Secondary | ICD-10-CM

## 2019-11-28 NOTE — Progress Notes (Addendum)
Subjective:    Patient ID: Darrell Abu Sr., male    DOB: 10-23-1955, 64 y.o.   MRN: [id number]  Darrell Boutilier Crabbe Sr. is a 64 y.o. male presenting on 11/28/2019 for Headache (note from mask --causing chest pain and HA) and Sinusitis (seasonal as per patient)   HPI   Environmental Allergies / Allergic reaction to mask Reports recently [employer] with masks has been changed, he was able to wear fabric mask and did well, then they changed it to require blue surgical mask material, he has attempted to wear it several times and has had to take it off due to headaches and coughing and seems to have fairly prompt allergic reaction to wearing it. He is requesting consultation today to determine if it appropriate to wear alternative mask. - He has no asthma or COPD or other breathing disorder.  Health Maintenance: Updated on COVID19 vaccine last month, [place] series has been completed.  Past Medical History:  Diagnosis Date  . Allergy   . Gout   . Thrombocytosis ([place])     Depression screen [facility] 2/9 11/28/2019 05/27/2018 04/18/2018  Decreased Interest 0 0 0  Down, Depressed, Hopeless 0 0 0  PHQ - 2 Score 0 0 0    Social History   Tobacco Use  . Smoking status: Never Smoker  . Smokeless tobacco: Never Used  Substance Use Topics  . Alcohol use: Yes  . Drug use: No    Review of Systems Per HPI unless specifically indicated above     Objective:    BP (!) 158/80   Pulse 88   Temp 97.6 F (36.4 C) (Temporal)   Resp 16   Ht 5\' 11"  (1.803 m)   Wt 232 lb (105.2 kg)   SpO2 96%   BMI 32.36 kg/m   Wt Readings from Last 3 Encounters:  11/28/19 232 lb (105.2 kg)  08/24/18 222 lb (100.7 kg)  05/27/18 226 lb 9.6 oz (102.8 kg)    Physical Exam Vitals and nursing note reviewed.  Constitutional:      General: He is not in acute distress.    Appearance: He is well-developed. He is not diaphoretic.     Comments: Well-appearing, comfortable, cooperative  HENT:     Head:  Normocephalic and atraumatic.  Eyes:     General:        Right eye: No discharge.        Left eye: No discharge.     Conjunctiva/sclera: Conjunctivae normal.  Cardiovascular:     Rate and Rhythm: Normal rate.  Pulmonary:     Effort: Pulmonary effort is normal.  Skin:    General: Skin is warm and dry.     Findings: No erythema or rash.  Neurological:     Mental Status: He is alert and oriented to person, place, and time.  Psychiatric:        Behavior: Behavior normal.     Comments: Well groomed, good eye contact, normal speech and thoughts    Results for orders placed or performed in visit on 12/08/18  HM DIABETES EYE EXAM  Result Value Ref Range   HM Diabetic Eye Exam No Retinopathy No Retinopathy      Assessment & Plan:   Problem List Items Addressed This Visit    None    Visit Diagnoses    Allergic contact dermatitis due to other agents    -  Primary     Since stopped wearing blue surgical masks  Allergic reaction has resolved Headache has resolved  Seems to have very clear association with trigger of allergic response symptoms and headaches / provoking his breathing or cough acutely due to wearing blue surgical mask - therefore it is my medical opinion that he should wear alternative mask as long as it is required by his employer.  Reassuring that he has completed his COVID19 vaccine series already.  Letter written, printed and given to patient today. He should wear fabric mask or alternative would be Magazine features editor mask/shield.  No orders of the defined types were placed in this encounter.     Follow up plan: Return in about 6 weeks (around 01/09/2020) for Annual Physical.  Future labs ordered for [facility] - given to patient, return within 4-6 weeks for physical   [name], [facility] 11/28/2019, 8:21 AM

## 2019-11-28 NOTE — Patient Instructions (Addendum)
Thank you for coming to the office today.  Note written for mask exemption today  DUE for FASTING BLOOD WORK (no food or drink after midnight before the lab appointment, only water or coffee without cream/sugar on the morning of)  [facility] only come in 1 week after lab draw  For Lab Results, once available within 2-3 days of blood draw, you can can log in to MyChart online to view your results and a brief explanation. Also, we can discuss results at next follow-up visit.   Please schedule a Follow-up Appointment to: Return in about 6 weeks (around 01/09/2020) for Annual Physical.  If you have any other questions or concerns, please feel free to call the office or send a message through [place]. You may also schedule an earlier appointment if necessary.  Additionally, you may be receiving a survey about your experience at our office within a few days to 1 week by e-mail or mail. We value your feedback.  [name], DO [place]
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
## 2020-04-25 LAB — CBC WITH DIFFERENTIAL/PLATELET
Basophils Absolute: 0.1 10*3/uL (ref 0.0–0.2)
Basos: 1 %
EOS (ABSOLUTE): 0.3 10*3/uL (ref 0.0–0.4)
Eos: 8 %
Hematocrit: 42.5 % (ref 37.5–51.0)
Hemoglobin: 14.4 g/dL (ref 13.0–17.7)
Immature Grans (Abs): 0 10*3/uL (ref 0.0–0.1)
Immature Granulocytes: 0 %
Lymphocytes Absolute: 2.1 10*3/uL (ref 0.7–3.1)
Lymphs: 49 %
MCH: 28.9 pg (ref 26.6–33.0)
MCHC: 33.9 g/dL (ref 31.5–35.7)
MCV: 85 fL (ref 79–97)
Monocytes Absolute: 0.5 10*3/uL (ref 0.1–0.9)
Monocytes: 11 %
Neutrophils Absolute: 1.3 10*3/uL — ABNORMAL LOW (ref 1.4–7.0)
Neutrophils: 31 %
Platelets: 408 10*3/uL (ref 150–450)
RBC: 4.98 x10E6/uL (ref 4.14–5.80)
RDW: 12.8 % (ref 11.6–15.4)
WBC: 4.3 10*3/uL (ref 3.4–10.8)

## 2020-04-25 LAB — LIPID PANEL
Chol/HDL Ratio: 3.6 ratio (ref 0.0–5.0)
Cholesterol, Total: 243 mg/dL — ABNORMAL HIGH (ref 100–199)
HDL: 68 mg/dL (ref >39)
LDL Chol Calc (NIH): 154 mg/dL — ABNORMAL HIGH (ref 0–99)
Triglycerides: 122 mg/dL (ref 0–149)
VLDL Cholesterol Cal: 21 mg/dL (ref 5–40)

## 2020-04-25 LAB — COMPREHENSIVE METABOLIC PANEL
ALT: 22 IU/L (ref 0–44)
AST: 19 IU/L (ref 0–40)
Albumin/Globulin Ratio: 1.5 (ref 1.2–2.2)
Albumin: 4.2 g/dL (ref 3.8–4.8)
Alkaline Phosphatase: 53 IU/L (ref 48–121)
BUN/Creatinine Ratio: 13 (ref 10–24)
BUN: 10 mg/dL (ref 8–27)
Bilirubin Total: 0.6 mg/dL (ref 0.0–1.2)
CO2: 25 mmol/L (ref 20–29)
Calcium: 9.2 mg/dL (ref 8.6–10.2)
Chloride: 101 mmol/L (ref 96–106)
Creatinine, Ser: 0.75 mg/dL — ABNORMAL LOW (ref 0.76–1.27)
GFR calc Af Amer: 114 mL/min/{1.73_m2} (ref >59)
GFR calc non Af Amer: 98 mL/min/{1.73_m2} (ref >59)
Globulin, Total: 2.8 g/dL (ref 1.5–4.5)
Glucose: 117 mg/dL — ABNORMAL HIGH (ref 65–99)
Potassium: 3.9 mmol/L (ref 3.5–5.2)
Sodium: 142 mmol/L (ref 134–144)
Total Protein: 7 g/dL (ref 6.0–8.5)

## 2020-04-25 LAB — PSA: Prostate Specific Ag, Serum: 1.6 ng/mL (ref 0.0–4.0)

## 2020-04-25 LAB — HEMOGLOBIN A1C
Est. average glucose Bld gHb Est-mCnc: 137 mg/dL
Hgb A1c MFr Bld: 6.4 % — ABNORMAL HIGH (ref 4.8–5.6)

## 2020-04-25 LAB — URIC ACID: Uric Acid: 7.6 mg/dL (ref 3.8–8.4)

## 2020-04-26 ENCOUNTER — Other Ambulatory Visit: Payer: Self-pay

## 2020-04-26 ENCOUNTER — Ambulatory Visit (INDEPENDENT_AMBULATORY_CARE_PROVIDER_SITE_OTHER): Payer: Managed Care, Other (non HMO) | Admitting: Family Medicine

## 2020-04-26 ENCOUNTER — Encounter: Payer: Self-pay | Admitting: Family Medicine

## 2020-04-26 VITALS — BP 159/86 | HR 92 | Temp 96.6°F | Resp 16 | Ht 71.0 in | Wt 226.4 lb

## 2020-04-26 DIAGNOSIS — I10 Essential (primary) hypertension: Secondary | ICD-10-CM

## 2020-04-26 DIAGNOSIS — Z1211 Encounter for screening for malignant neoplasm of colon: Secondary | ICD-10-CM

## 2020-04-26 DIAGNOSIS — R3914 Feeling of incomplete bladder emptying: Secondary | ICD-10-CM

## 2020-04-26 DIAGNOSIS — N401 Enlarged prostate with lower urinary tract symptoms: Secondary | ICD-10-CM | POA: Diagnosis not present

## 2020-04-26 DIAGNOSIS — Z0000 Encounter for general adult medical examination without abnormal findings: Secondary | ICD-10-CM

## 2020-04-26 DIAGNOSIS — E669 Obesity, unspecified: Secondary | ICD-10-CM

## 2020-04-26 DIAGNOSIS — R7303 Prediabetes: Secondary | ICD-10-CM

## 2020-04-26 DIAGNOSIS — E782 Mixed hyperlipidemia: Secondary | ICD-10-CM

## 2020-04-26 MED ORDER — METOPROLOL SUCCINATE ER 25 MG PO TB24: 25.0000 mg | ORAL_TABLET | Freq: Every day | ORAL | 3 refills | Status: DC

## 2020-04-26 MED ORDER — ROSUVASTATIN CALCIUM 10 MG PO TABS: 10.0000 mg | ORAL_TABLET | Freq: Every day | ORAL | 3 refills | Status: DC

## 2020-04-26 NOTE — Progress Notes (Signed)
Subjective:    Patient ID: Darrell Abu Sr., male    DOB: 1956-04-28, 64 y.o.   MRN: [id number]  Darrell Kloster Chisom Sr. is a 64 y.o. male presenting on 04/26/2020 for Follow-up (had physical lab work last week)   HPI   Here for Annual Physical and Lab Review  Pre-Diabetes / Obesity BMI >31 Still has elevated A1c at 6.4 on last lab CBGs: Not checking sugar Meds: Metformin 500mg  daily - NOT TAKING - he has stopped this for a while, unsure why, he tolerated it well. Currently on ACEi Lifestyle: - Diet (not adhering to low carb diet, he plans to restart healthier diet)  - Exercise (limited activity, now more sedentary) Denies hypoglycemia  Chronic Gout No recent gout flares. Uric Acid improved to 7.6 (04/2020), previously 8.0  Not on prophylaxis  CHRONIC HTN: Reports no new concern, some readings checked had been elevated >140 Current Meds - Lisinopril 40mg  daily, Amlodipine 10mg  daily (he was OFF Metoprolol XL 25mg ) Reports good compliance, took meds today. Tolerating well, w/o complaints. Denies CP, dyspnea, HA, edema, dizziness / lightheadedness  HYPERLIPIDEMIA: - Reports no concern. Last lipid panel 04/2020, elevated total, LDL >154  Not on statin  Mild Enlarged BPH with LUTS / Erectile Dysfunction Chronic problem. Mild symptoms currently On Sildenafil PRN  Thrombocytosis Prior elevated plt, chronic problem. Previously followed by Hematology, no longer. Due for lab will check today. Denies concerns.  Health Maintenance:  Due routine HIV screen lab, ordered.  Due for Flu Shot, will receive today   Prostate CA Screening: No prior prostate CA screening did have DRE reported normal few years ago by prior PCP, No known PSA on file. Currently very mild BPH LUTS. No known family history of prostate CA. Due for screening.  PSA 1.6  Colon CA Screening:  Last colonoscopy report, done 09/28/12, no polyps, recommended return in 5 years for repeat - he did not follow  through with scheduling colonoscopy due to covid in 2020  Ordered cologuard today    Depression screen [facility] 2/9 04/26/2020 11/28/2019 05/27/2018  Decreased Interest 0 0 0  Down, Depressed, Hopeless 0 0 0  PHQ - 2 Score 0 0 0    Past Medical History:  Diagnosis Date  . Allergy   . Gout   . Thrombocytosis ([place])    History reviewed. No pertinent surgical history. Social History   Socioeconomic History  . Marital status: Married    Spouse name: Not on file  . Number of children: Not on file  . Years of education: Not on file  . Highest education level: Not on file  Occupational History  . Not on file  Tobacco Use  . Smoking status: Never Smoker  . Smokeless tobacco: Never Used  Substance and Sexual Activity  . Alcohol use: Yes  . Drug use: No  . Sexual activity: Not on file  Other Topics Concern  . Not on file  Social History Narrative  . Not on file   Social Determinants of Health   Financial Resource Strain:   . Difficulty of Paying Living Expenses:   Food Insecurity:   . Worried About Charity fundraiser in the Last Year:   . Arboriculturist in the Last Year:   Transportation Needs:   . Film/video editor (Medical):   Marland Kitchen Lack of Transportation (Non-Medical):   Physical Activity:   . Days of Exercise per Week:   . Minutes of Exercise per Session:  Stress:   . Feeling of Stress :   Social Connections:   . Frequency of Communication with Friends and Family:   . Frequency of Social Gatherings with Friends and Family:   . Attends Religious Services:   . Active Member of Clubs or Organizations:   . Attends Archivist Meetings:   Marland Kitchen Marital Status:   Intimate Partner Violence:   . Fear of Current or Ex-Partner:   . Emotionally Abused:   Marland Kitchen Physically Abused:   . Sexually Abused:    Family History  Problem Relation Age of Onset  . Diabetes Mother   . Prostate cancer Neg Hx   . Colon cancer Neg Hx    Current Outpatient Medications on File Prior to  Visit  Medication Sig  . amLODipine (NORVASC) 10 MG tablet TAKE 1 TABLET BY MOUTH  DAILY FOR BLOOD PRESSURE  . aspirin EC 81 MG tablet Take 1 tablet (81 mg total) by mouth daily.  Marland Kitchen lisinopril (ZESTRIL) 40 MG tablet TAKE 1 TABLET BY MOUTH  DAILY  . ONE TOUCH ULTRA TEST test strip   . sildenafil (REVATIO) 20 MG tablet Take 1-5 pills about 30 min prior to sex. Start with 1 and increase as needed.   No current facility-administered medications on file prior to visit.    Review of Systems  Constitutional: Negative for activity change, appetite change, chills, diaphoresis, fatigue and fever.  HENT: Negative for congestion and hearing loss.   Eyes: Negative for visual disturbance.  Respiratory: Negative for apnea, cough, choking, chest tightness, shortness of breath and wheezing.   Cardiovascular: Negative for chest pain, palpitations and leg swelling.  Gastrointestinal: Negative for abdominal pain, anal bleeding, blood in stool, constipation, diarrhea, nausea and vomiting.  Endocrine: Negative for cold intolerance.  Genitourinary: Negative for difficulty urinating, dysuria, frequency and hematuria.  Musculoskeletal: Negative for arthralgias, back pain and neck pain.  Skin: Negative for rash.  Allergic/Immunologic: Negative for environmental allergies.  Neurological: Negative for dizziness, weakness, light-headedness, numbness and headaches.  Hematological: Negative for adenopathy.  Psychiatric/Behavioral: Negative for behavioral problems, dysphoric mood and sleep disturbance. The patient is not nervous/anxious.    Per HPI unless specifically indicated above      Objective:    BP (!) 159/86   Pulse 92   Temp (!) 96.6 F (35.9 C) (Temporal)   Resp 16   Ht 5\' 11"  (1.803 m)   Wt 226 lb 6.4 oz (102.7 kg)   SpO2 97%   BMI 31.58 kg/m   Wt Readings from Last 3 Encounters:  04/26/20 226 lb 6.4 oz (102.7 kg)  11/28/19 232 lb (105.2 kg)  08/24/18 222 lb (100.7 kg)    Physical  Exam Vitals and nursing note reviewed.  Constitutional:      General: He is not in acute distress.    Appearance: He is well-developed. He is obese. He is not diaphoretic.     Comments: Well-appearing, comfortable, cooperative  HENT:     Head: Normocephalic and atraumatic.  Eyes:     General:        Right eye: No discharge.        Left eye: No discharge.     Conjunctiva/sclera: Conjunctivae normal.     Pupils: Pupils are equal, round, and reactive to light.  Neck:     Thyroid: No thyromegaly.     Vascular: No carotid bruit.  Cardiovascular:     Rate and Rhythm: Normal rate and regular rhythm.     Heart sounds:  Normal heart sounds. No murmur heard.   Pulmonary:     Effort: Pulmonary effort is normal. No respiratory distress.     Breath sounds: Normal breath sounds. No wheezing or rales.  Abdominal:     General: Bowel sounds are normal. There is no distension.     Palpations: Abdomen is soft. There is no mass.     Tenderness: There is no abdominal tenderness.  Musculoskeletal:        General: No tenderness. Normal range of motion.     Cervical back: Normal range of motion and neck supple.     Right lower leg: No edema.     Left lower leg: No edema.     Comments: Upper / Lower Extremities: - Normal muscle tone, strength bilateral upper extremities 5/5, lower extremities 5/5  Lymphadenopathy:     Cervical: No cervical adenopathy.  Skin:    General: Skin is warm and dry.     Findings: No erythema or rash.  Neurological:     Mental Status: He is alert and oriented to person, place, and time.     Comments: Distal sensation intact to light touch all extremities  Psychiatric:        Behavior: Behavior normal.     Comments: Well groomed, good eye contact, normal speech and thoughts        Results for orders placed or performed in visit on 11/28/19  Comprehensive metabolic panel  Result Value Ref Range   Glucose 117 (H) 65 - 99 mg/dL   BUN 10 8 - 27 mg/dL   Creatinine,  Ser 0.75 (L) 0.76 - 1.27 mg/dL   GFR calc non Af Amer 98 >59 mL/min/1.73   GFR calc Af Amer 114 >59 mL/min/1.73   BUN/Creatinine Ratio 13 10 - 24   Sodium 142 134 - 144 mmol/L   Potassium 3.9 3.5 - 5.2 mmol/L   Chloride 101 96 - 106 mmol/L   CO2 25 20 - 29 mmol/L   Calcium 9.2 8.6 - 10.2 mg/dL   Total Protein 7.0 6.0 - 8.5 g/dL   Albumin 4.2 3.8 - 4.8 g/dL   Globulin, Total 2.8 1.5 - 4.5 g/dL   Albumin/Globulin Ratio 1.5 1.2 - 2.2   Bilirubin Total 0.6 0.0 - 1.2 mg/dL   Alkaline Phosphatase 53 48 - 121 IU/L   AST 19 0 - 40 IU/L   ALT 22 0 - 44 IU/L  Uric acid  Result Value Ref Range   Uric Acid 7.6 3.8 - 8.4 mg/dL  PSA  Result Value Ref Range   Prostate Specific Ag, Serum 1.6 0.0 - 4.0 ng/mL  Lipid panel  Result Value Ref Range   Cholesterol, Total 243 (H) 100 - 199 mg/dL   Triglycerides 122 0 - 149 mg/dL   HDL 68 >39 mg/dL   VLDL Cholesterol Cal 21 5 - 40 mg/dL   LDL Chol Calc (NIH) 154 (H) 0 - 99 mg/dL   Chol/HDL Ratio 3.6 0.0 - 5.0 ratio  CBC with Differential/Platelet  Result Value Ref Range   WBC 4.3 3.4 - 10.8 x10E3/uL   RBC 4.98 4.14 - 5.80 x10E6/uL   Hemoglobin 14.4 13.0 - 17.7 g/dL   Hematocrit 42.5 37.5 - 51.0 %   MCV 85 79 - 97 fL   MCH 28.9 26.6 - 33.0 pg   MCHC 33.9 31 - 35 g/dL   RDW 12.8 11.6 - 15.4 %   Platelets 408 150 - 450 x10E3/uL   Neutrophils 31 Not Estab. %  Lymphs 49 Not Estab. %   Monocytes 11 Not Estab. %   Eos 8 Not Estab. %   Basos 1 Not Estab. %   Neutrophils Absolute 1.3 (L) 1 - 7 x10E3/uL   Lymphocytes Absolute 2.1 0 - 3 x10E3/uL   Monocytes Absolute 0.5 0 - 0 x10E3/uL   EOS (ABSOLUTE) 0.3 0.0 - 0.4 x10E3/uL   Basophils Absolute 0.1 0 - 0 x10E3/uL   Immature Granulocytes 0 Not Estab. %   Immature Grans (Abs) 0.0 0.0 - 0.1 x10E3/uL  Hemoglobin A1c  Result Value Ref Range   Hgb A1c MFr Bld 6.4 (H) 4.8 - 5.6 %   Est. average glucose Bld gHb Est-mCnc 137 mg/dL      Assessment & Plan:   Problem List Items Addressed This Visit     Resistant hypertension    Elevated BP still Non adherence to some meds, and limited lifestyle - Home BP readings limited readings but will use new cuff now by his report  Previously on HCTZ, however seems may have been correlated with more gout flares more, now off med    Plan:  1. RESTART Metoprolol XL 25mg  daily previously on, had stopped - Continue Amlodipine 10mg  daily, Lisinopril 40mg  daily 2. Encourage improved lifestyle - low sodium diet, regular exercise 3. Continue monitor BP outside office, bring readings to next visit, if persistently >140/90 or new symptoms notify office sooner 4. Follow-up within 4-6 weeks for repeat BP management back on Metoprolol and improve lifestyle, if still sub optimal will offer back Thiazide and other options. Also consider alpha blocker for BPH and HTN       Relevant Medications   metoprolol succinate (TOPROL-XL) 25 MG 24 hr tablet   rosuvastatin (CRESTOR) 10 MG tablet   Pre-diabetes    Elevated A1c up to 6.4 persistent from last time with poorly controlled Pre-DM - prior readings 6.0-6.2 Concern with obesity, HTN, HLD  Plan:  1. Discussion on medication vs lifestyle, ultimately he has been off and on metformin, will pursue lifestyle management now. 2. Encourage improved lifestyle - low carb, low sugar diet, reduce portion size, continue improving regular exercise 3. Follow-up q promptly 6 weeks, in future will work on trending A1c and anticipate restart of therapy if indicated      Obesity (BMI 30.0-34.9)    Recent weight loss 232 to 226 ENcourage lifestyle diet exercise      Hyperlipidemia    Uncontrolled cholesterol poor lifestyle Last lipid panel 04/2020  The 10-year ASCVD risk score Mikey Bussing DC Jr., et al., 2013) is: 20.4%   Values used to calculate the score:     Age: 74 years     Sex: Male     Is Non-Hispanic African American: Yes     Diabetic: No     Tobacco smoker: No     Systolic Blood Pressure: 465 mmHg     Is BP treated:  Yes     HDL Cholesterol: 68 mg/dL     Total Cholesterol: 243 mg/dL   Plan: 1. START new statin - Rosuvastatin 10mg  daily - counseling provided on benefit risk side effect, dosing 2. Continue ASA 81mg  for primary ASCVD risk reduction 3. Encourage improved lifestyle - low carb/cholesterol, reduce portion size, continue improving regular exercise      Relevant Medications   metoprolol succinate (TOPROL-XL) 25 MG 24 hr tablet   rosuvastatin (CRESTOR) 10 MG tablet   Benign prostatic hyperplasia with incomplete bladder emptying    Stable, clinically with BPH,  with lower urinary tract symptoms (LUTS) - AUA BPH score 5 (mild, 05/27/18) - no prior score - No prior treatment Last PSA 1.6 (2021) - Last DRE years ago normal reported - No known personal/family history of prostate CA  Plan: May consider alpha blocker for both BPH and HTN        Other Visit Diagnoses    Annual physical exam    -  Primary   Screening for colon cancer       Relevant Orders   Cologuard      Updated Health Maintenance information  Due for routine colon cancer screening Prior colonoscopy 2014 No family history colon cancer. - Discussion today about recommendations for either Colonoscopy or Cologuard screening, benefits and risks of screening, interested in Cologuard, understands that if positive then recommendation is for diagnostic colonoscopy to follow-up. - Ordered Cologuard today  Reviewed recent lab results with patient Encouraged improvement to lifestyle with diet and exercise - Goal of weight loss    Meds ordered this encounter  Medications  . metoprolol succinate (TOPROL-XL) 25 MG 24 hr tablet    Sig: Take 1 tablet (25 mg total) by mouth daily.    Dispense:  90 tablet    Refill:  3  . rosuvastatin (CRESTOR) 10 MG tablet    Sig: Take 1 tablet (10 mg total) by mouth at bedtime.    Dispense:  90 tablet    Refill:  3      Follow up plan: Return in about 6 weeks (around 06/07/2020) for 6  weeks follow-up HTN, HLD, morning visit maybe labs after.  [name], [facility] 04/26/2020, 2:26 PM

## 2020-04-26 NOTE — Patient Instructions (Addendum)
Thank you for coming to the office today.  WIll have Flu Vaccine in office by end of August Early September, call to check and schedule.  You are at increased risk of future Cardiovascular complications such as Heart Attack or Stroke from an artery blockage due to abnormal cholesterol and/or risk factors. - As discussed, Statin Cholesterol pills both can both LOWER cholesterol and REDUCE this future risk of heart attack and stroke - Start Rosuvastatin (generic Crestor) 10mg  pill once at bedtime every night  If you develop mild aches or pains in muscle or joint that does NOT improve or go away after first 3-4 weeks then this may require [place] to adjust the dose. First I would recommend STOPPING the medication for a few weeks until your ache and pain symptoms completely RESOLVE. Then you can restart at a LOWER DOSE either HALF a pill at bedtime every night or LESS OFTEN such as one pill a week only and then gradually increase to every other day or max dose of 3 times a week  Lastly, sometimes we need to try other versions of this medicine to find one that works for you and does not cause side effects.   Colon Cancer Screening: - For all adults age 23+ routine colon cancer screening is highly recommended.     - Recent guidelines from [place] recommend starting age of 63 - Early detection of colon cancer is important, because often there are no warning signs or symptoms, also if found early usually it can be cured. Late stage is hard to treat.  - If you are not interested in Colonoscopy screening (if done and normal you could be cleared for 5 to 10 years until next due), then Cologuard is an excellent alternative for screening test for Colon Cancer. It is highly sensitive for detecting DNA of colon cancer from even the earliest stages. Also, there is NO bowel prep required. - If Cologuard is NEGATIVE, then it is good for 3 years before next due - If Cologuard is POSITIVE, then it is  strongly advised to get a Colonoscopy, which allows the GI doctor to locate the source of the cancer or polyp (even very early stage) and treat it by removing it. ------------------------- ORDERED COLOGUARD  Follow instructions to collect sample, you may call the company for any help or questions, 24/7 telephone support at [phone].  DUE for FASTING BLOOD WORK (no food or drink after midnight before the lab appointment, only water or coffee without cream/sugar on the morning of)  6 weeks after visit  For Lab Results, once available within 2-3 days of blood draw, you can can log in to MyChart online to view your results and a brief explanation. Also, we can discuss results at next follow-up visit.   Please schedule a Follow-up Appointment to: Return in about 6 weeks (around 06/07/2020) for 6 weeks follow-up HTN, HLD, morning visit maybe labs after.  If you have any other questions or concerns, please feel free to call the office or send a message through [place]. You may also schedule an earlier appointment if necessary.  Additionally, you may be receiving a survey about your experience at our office within a few days to 1 week by e-mail or mail. We value your feedback.  [name], DO [place]

## 2020-04-27 NOTE — Assessment & Plan Note (Signed)
Uncontrolled cholesterol poor lifestyle Last lipid panel 04/2020  The 10-year ASCVD risk score Mikey Bussing DC Brooke Bonito., et al., 2013) is: 20.4%   Values used to calculate the score:     Age: 64 years     Sex: Male     Is Non-Hispanic African American: Yes     Diabetic: No     Tobacco smoker: No     Systolic Blood Pressure: 721 mmHg     Is BP treated: Yes     HDL Cholesterol: 68 mg/dL     Total Cholesterol: 243 mg/dL   Plan: 1. START new statin - Rosuvastatin 10mg  daily - counseling provided on benefit risk side effect, dosing 2. Continue ASA 81mg  for primary ASCVD risk reduction 3. Encourage improved lifestyle - low carb/cholesterol, reduce portion size, continue improving regular exercise

## 2020-04-27 NOTE — Assessment & Plan Note (Signed)
Stable, clinically with BPH, with lower urinary tract symptoms (LUTS) - AUA BPH score 5 (mild, 05/27/18) - no prior score - No prior treatment Last PSA 1.6 (2021) - Last DRE years ago normal reported - No known personal/family history of prostate CA  Plan: May consider alpha blocker for both BPH and HTN

## 2020-04-27 NOTE — Assessment & Plan Note (Signed)
Recent weight loss 232 to 226 ENcourage lifestyle diet exercise

## 2020-04-27 NOTE — Assessment & Plan Note (Signed)
Elevated A1c up to 6.4 persistent from last time with poorly controlled Pre-DM - prior readings 6.0-6.2 Concern with obesity, HTN, HLD  Plan:  1. Discussion on medication vs lifestyle, ultimately he has been off and on metformin, will pursue lifestyle management now. 2. Encourage improved lifestyle - low carb, low sugar diet, reduce portion size, continue improving regular exercise 3. Follow-up q promptly 6 weeks, in future will work on trending A1c and anticipate restart of therapy if indicated

## 2020-04-27 NOTE — Assessment & Plan Note (Addendum)
Elevated BP still Non adherence to some meds, and limited lifestyle - Home BP readings limited readings but will use new cuff now by his report  Previously on HCTZ, however seems may have been correlated with more gout flares more, now off med    Plan:  1. RESTART Metoprolol XL 25mg  daily previously on, had stopped - Continue Amlodipine 10mg  daily, Lisinopril 40mg  daily 2. Encourage improved lifestyle - low sodium diet, regular exercise 3. Continue monitor BP outside office, bring readings to next visit, if persistently >140/90 or new symptoms notify office sooner 4. Follow-up within 4-6 weeks for repeat BP management back on Metoprolol and improve lifestyle, if still sub optimal will offer back Thiazide and other options. Also consider alpha blocker for BPH and HTN

## 2020-05-14 LAB — COLOGUARD: Cologuard: NEGATIVE

## 2020-05-22 ENCOUNTER — Other Ambulatory Visit: Payer: Self-pay

## 2020-05-22 ENCOUNTER — Encounter: Payer: Self-pay | Admitting: Family Medicine

## 2020-05-22 ENCOUNTER — Telehealth (INDEPENDENT_AMBULATORY_CARE_PROVIDER_SITE_OTHER): Payer: Managed Care, Other (non HMO) | Admitting: Family Medicine

## 2020-05-22 VITALS — BP 182/94 | Ht 71.0 in | Wt 226.0 lb

## 2020-05-22 DIAGNOSIS — U071 COVID-19: Secondary | ICD-10-CM | POA: Diagnosis not present

## 2020-05-22 MED ORDER — BENZONATATE 100 MG PO CAPS: 100.0000 mg | ORAL_CAPSULE | Freq: Three times a day (TID) | ORAL | 0 refills | Status: DC | PRN

## 2020-05-22 NOTE — Patient Instructions (Addendum)
FMLA out of work for 2 weeks 05/20/20 - 06/02/20. We will fax it.  Start Tessalon Perls take 1 capsule up to 3 times a day as needed for cough  Retest prior to return to work  [employer] as discussed  If severe symptoms or worsening, seek care at urgent care / hospital, if interested we can refer to [facility] for IV antibody infusion   Please schedule a Follow-up Appointment to: Return in about 2 weeks (around 06/05/2020), or if symptoms worsen or fail to improve, for COVID positive if not improved.  If you have any other questions or concerns, please feel free to call the office or send a message through [place]. You may also schedule an earlier appointment if necessary.  Additionally, you may be receiving a survey about your experience at our office within a few days to 1 week by e-mail or mail. We value your feedback.  [name], DO [place]

## 2020-05-22 NOTE — Progress Notes (Signed)
Virtual Visit via Telephone The purpose of this virtual visit is to provide medical care while limiting exposure to the novel coronavirus (COVID19) for both patient and office staff.  Consent was obtained for phone visit:  Yes.   Answered questions that patient had about telehealth interaction:  Yes.   I discussed the limitations, risks, security and privacy concerns of performing an evaluation and management service by telephone. I also discussed with the patient that there may be a patient responsible charge related to this service. The patient expressed understanding and agreed to proceed.  Patient Location: Home Provider Location: [facility])   ---------------------------------------------------------------------- Chief Complaint  Patient presents with  . FMLA    patient was diagnosed positive for Covid and work wants FMLA filled out     S: Reviewed CMA documentation. I have called patient and gathered additional HPI as follows:  [place] He has history of prior [place] vaccination [place] 10/11/19 and 11/04/19 - Sick contact wife had first dose of Moderna recently and after that developed symptoms and later tested POSITIVE. - He was tested due to wife's positive test and close contact. His test result came back [place] on Monday 05/20/20. He has remained out of work starting Monday 05/20/20. His symptoms started with mild cough initially on Monday, and then developed worsening symptoms next 2 days, with body aches in shoulders, and reduced appetite. - He says he is bothered by sinus issues usually but now it is worse with sinus congestion / pressure - Taking OTC Delsym for cough congestion PRN  FMLA through work was advised out 2 weeks, 05/20/20 through 06/02/20 and anticipate return on Monday 06/03/20.  Patient currently in isolation  Denies any fevers, chills, sweats, cough, shortness of breath, sinus pain or pressure, abdominal pain,  diarrhea  -------------------------------------------------------------------------- O: No physical exam performed due to remote telephone encounter.  -------------------------------------------------------------------------- A&P:   COVID19 infection Uncomplicated, mild case Afebrile, onset symptoms 2 days ago POSITIVE COVID19 test confirmed Monday 05/20/20 Sick contact wife, with COVID He was vaccinated [employer] 09/2019 and 10/2019  - Reassuring without high risk symptoms - Afebrile, without dyspnea - No comorbid pulmonary conditions (asthma, COPD) or immunocompromise   1. Supportive treatment for COVID, in quarantine 2. Use OTC cough/cold flu medicines Delsym PRN 3. Tylenol PRN 4. Start Tessalon Perls take 1 capsule up to 3 times a day as needed for cough 5. Offered other options - cough syrup, prednisone, albuterol, zofran PRN he declined at this time, defer these options 6. Offer future IV antibody infusion [place] if needs referral - he declines right now, only mild symptoms  Complete his FMLA for COVID Unable to work from 05/20/20 - 06/02/20 Estimated return date 06/03/20, full duty [place] ICD U07.1  No orders of the defined types were placed in this encounter.   REQUIRED self quarantine to [place] - advised to avoid all exposure with others while during treatment. Should continue to quarantine for up to 14 days - Will need repeat test per his work protocol to return to work   If symptoms do not resolve or significantly improve OR if WORSENING - fever / cough - or worsening shortness of breath - then should contact us and seek advice on next steps in treatment at home vs where/when to seek care at Urgent Care or Hospital ED for further intervention and possible testing if indicated.  Patient verbalizes understanding with the above medical recommendations including the limitation of remote medical advice.  Specific follow-up / call-back criteria were given for  patient to follow-up or seek medical care more urgently if needed.   - Time spent in direct consultation with patient on phone: 15 minutes   [name], [facility] 05/22/2020, 1:27 PM

## 2020-05-23 ENCOUNTER — Encounter: Payer: Self-pay | Admitting: Family Medicine

## 2020-05-25 ENCOUNTER — Encounter: Payer: Self-pay | Admitting: Nurse Practitioner

## 2020-05-25 ENCOUNTER — Other Ambulatory Visit (HOSPITAL_COMMUNITY): Payer: Self-pay | Admitting: Nurse Practitioner

## 2020-05-25 DIAGNOSIS — U071 COVID-19: Secondary | ICD-10-CM

## 2020-05-25 NOTE — Progress Notes (Signed)
I connected by phone with Darrell Seats Melito Sr. on 05/25/2020 at 12:37 PM to discuss the potential use of an new treatment for mild to moderate COVID-19 viral infection in non-hospitalized patients.  This patient is a 64 y.o. male that meets the FDA criteria for Emergency Use Authorization of casirivimab\imdevimab.  Has a (+) direct SARS-CoV-2 viral test result  Has mild or moderate COVID-19   Is ? 63 years of age and weighs ? 40 kg  Is NOT hospitalized due to COVID-19  Is NOT requiring oxygen therapy or requiring an increase in baseline oxygen flow rate due to COVID-19  Is within 10 days of symptom onset  Has at least one of the high risk factor(s) for progression to severe COVID-19 and/or hospitalization as defined in EUA.  Specific high risk criteria : Cardiovascular disease or hypertension, obesity  Onset 8/30. Vaccinated  I have spoken and communicated the following to the patient or parent/caregiver:  1. FDA has authorized the emergency use of casirivimab\imdevimab for the treatment of mild to moderate COVID-19 in adults and pediatric patients with positive results of direct SARS-CoV-2 viral testing who are 49 years of age and older weighing at least 40 kg, and who are at high risk for progressing to severe COVID-19 and/or hospitalization.  2. The significant known and potential risks and benefits of casirivimab\imdevimab, and the extent to which such potential risks and benefits are unknown.  3. Information on available alternative treatments and the risks and benefits of those alternatives, including clinical trials.  4. Patients treated with casirivimab\imdevimab should continue to self-isolate and use infection control measures (e.g., wear mask, isolate, social distance, avoid sharing personal items, clean and disinfect "high touch" surfaces, and frequent handwashing) according to CDC guidelines.   5. The patient or parent/caregiver has the option to accept or refuse  casirivimab\imdevimab .  After reviewing this information with the patient, The patient agreed to proceed with receiving casirivimab\imdevimab infusion and will be provided a copy of the Fact sheet prior to receiving the infusion.Beckey Rutter, [place], AGNP-C [phone] ([place])

## 2020-05-26 ENCOUNTER — Ambulatory Visit (HOSPITAL_COMMUNITY)
Admission: RE | Admit: 2020-05-26 | Discharge: 2020-05-26 | Disposition: A | Discharge status: Home / Self Care | Payer: Managed Care, Other (non HMO) | Source: Ambulatory Visit | Attending: Pulmonary Disease | Admitting: Pulmonary Disease

## 2020-05-26 DIAGNOSIS — U071 COVID-19: Secondary | ICD-10-CM | POA: Diagnosis not present

## 2020-05-26 MED ORDER — ALBUTEROL SULFATE HFA 108 (90 BASE) MCG/ACT IN AERS: 2.0000 | INHALATION_SPRAY | Freq: Once | RESPIRATORY_TRACT | Status: DC | PRN

## 2020-05-26 MED ORDER — FAMOTIDINE IN NACL 20-0.9 MG/50ML-% IV SOLN: 20.0000 mg | Freq: Once | INTRAVENOUS | Status: DC | PRN

## 2020-05-26 MED ORDER — DIPHENHYDRAMINE HCL 50 MG/ML IJ SOLN: 50.0000 mg | Freq: Once | INTRAMUSCULAR | Status: DC | PRN

## 2020-05-26 MED ORDER — EPINEPHRINE 0.3 MG/0.3ML IJ SOAJ: 0.3000 mg | Freq: Once | INTRAMUSCULAR | Status: DC | PRN

## 2020-05-26 MED ORDER — METHYLPREDNISOLONE SODIUM SUCC 125 MG IJ SOLR: 125.0000 mg | Freq: Once | INTRAMUSCULAR | Status: DC | PRN

## 2020-05-26 MED ORDER — SODIUM CHLORIDE 0.9 % IV SOLN
1200.0000 mg | Freq: Once | INTRAVENOUS | Status: AC
  Administered 2020-05-26: 1200 mg via INTRAVENOUS

## 2020-05-26 MED ORDER — SODIUM CHLORIDE 0.9 % IV SOLN: INTRAVENOUS | Status: DC | PRN

## 2020-05-26 NOTE — Progress Notes (Signed)
Pt's BP elevated, Wilber Bihari, NP called and no new orders received, will start mAb infusion.

## 2020-05-26 NOTE — Discharge Instructions (Signed)

## 2020-05-26 NOTE — Progress Notes (Signed)
°  Diagnosis: COVID-19  Physician: Dr. Joya Gaskins  Procedure: Covid Infusion Clinic Med: casirivimab\imdevimab infusion - Provided patient with casirivimab\imdevimab fact sheet for patients, parents and caregivers prior to infusion.  Complications: No immediate complications noted.  Discharge: Discharged home   Cheri Guppy 05/26/2020

## 2020-05-29 ENCOUNTER — Ambulatory Visit: Payer: Managed Care, Other (non HMO) | Admitting: Family Medicine

## 2020-06-03 ENCOUNTER — Telehealth: Payer: Self-pay

## 2020-06-03 NOTE — Telephone Encounter (Unsigned)
Copied from [place] [phone]. Topic: General - Other >> Jun 03, 2020 10:58 AM Antonieta Iba C wrote: Reason for CRM: pt would like to return to work note for 9/20. Pt says that he has been out due to covid and is due to return on that date. Pt would like to have sent to his mychart if possible.   Please assist.

## 2020-06-03 NOTE — Telephone Encounter (Signed)
Work note sent to his mychart for return 06/10/20  [name], [facility] 06/03/2020, 5:50 PM

## 2020-06-03 NOTE — Telephone Encounter (Signed)
Copied from [place] [id number]. Topic: General - Other >> Jun 03, 2020 10:58 AM Antonieta Iba C wrote: Reason for CRM: pt would like to return to work note for 9/20. Pt says that he has been out due to covid and is due to return on that date. Pt would like to have sent to his mychart if possible.   Please assist.

## 2020-06-07 ENCOUNTER — Telehealth: Payer: Self-pay | Admitting: Family Medicine

## 2020-06-07 ENCOUNTER — Other Ambulatory Visit: Payer: Self-pay | Admitting: Family Medicine

## 2020-06-07 DIAGNOSIS — M109 Gout, unspecified: Secondary | ICD-10-CM

## 2020-06-07 MED ORDER — COLCHICINE 0.6 MG PO TABS: ORAL_TABLET | ORAL | 3 refills | Status: DC

## 2020-06-07 NOTE — Telephone Encounter (Signed)
Medication Refill - Medication: colchicine   Has the patient contacted their pharmacy? Yes.   Pt states that he took his last pill this morning and that he is having a flare up of gout in his right hand. Please advise.  (Agent: If no, request that the patient contact the pharmacy for the refill.) (Agent: If yes, when and what did the pharmacy advise?)  Preferred Pharmacy (with phone number or street name):  [place] [place] [facility] [address] [place] [address]  Phone: [phone] Fax: [phone]  Hours: Not open 24 hours     Agent: Please be advised that RX refills may take up to 3 business days. We ask that you follow-up with your pharmacy.

## 2020-06-07 NOTE — Telephone Encounter (Signed)
Requested medication (s) are due for refill today: no  Requested medication (s) are on the active medication list: no  Last refill: 11/12/2018  Future visit scheduled: no  Notes to clinic:  d/c'd by Dr Frederich Cha, 02/17/18    Requested Prescriptions  Pending Prescriptions Disp Refills   colchicine 0.6 MG tablet [Pharmacy Med Name: Colchicine 0.6 MG Oral Tablet] 10 tablet 0    Sig: Take 1 tablet by mouth once daily      Endocrinology:  Gout Agents Failed - 06/07/2020  9:53 AM      Failed - Cr in normal range and within 360 days    Creatinine, Ser  Date Value Ref Range Status  04/24/2020 0.75 (L) 0.76 - 1.27 mg/dL Final   Creatinine, POC  Date Value Ref Range Status  09/03/2015 negative mg/dL Final          Passed - Uric Acid in normal range and within 360 days    Uric Acid  Date Value Ref Range Status  04/24/2020 7.6 3.8 - 8.4 mg/dL Final    Comment:               Therapeutic target for gout patients: <6.0          Passed - Valid encounter within last 12 months    Recent Outpatient Visits           2 weeks ago COVID-19 virus infection   [place], DO   1 month ago Annual physical exam   [place], DO   6 months ago Allergic contact dermatitis due to other agents   [facility] Olin Hauser, DO   2 years ago Annual physical exam   [place], DO   2 years ago Foot sprain, left, subsequent encounter   [place], Devonne Doughty, DO

## 2020-06-07 NOTE — Telephone Encounter (Signed)
Not on current medication list. Please advise.

## 2020-10-03 ENCOUNTER — Telehealth: Payer: Self-pay | Admitting: Family Medicine

## 2020-10-03 NOTE — Telephone Encounter (Signed)
Received FMLA Documentation from [place] by fax.  Last visit with patient was 04/2020 for annual physical.  I do not see any recent FMLA documentation for chronic medical condition.  He had prior FMLA short term only for acute injury in 2019 and for COVID in 2021.  Please call patient to schedule him for a visit - can do virtual visit for Form Completion / FMLA. Prefer to do this within 1-2 weeks, or if he has a deadline for this form. I have forms at my desk.  [name], [facility] 10/03/2020, 2:51 PM

## 2020-10-04 NOTE — Telephone Encounter (Signed)
Left a message with his wife to call us back to set up an appt.

## 2020-10-09 ENCOUNTER — Ambulatory Visit (INDEPENDENT_AMBULATORY_CARE_PROVIDER_SITE_OTHER): Payer: Managed Care, Other (non HMO) | Admitting: Family Medicine

## 2020-10-09 ENCOUNTER — Encounter: Payer: Self-pay | Admitting: Family Medicine

## 2020-10-09 ENCOUNTER — Other Ambulatory Visit: Payer: Self-pay

## 2020-10-09 VITALS — Ht 71.0 in | Wt 226.0 lb

## 2020-10-09 DIAGNOSIS — L2389 Allergic contact dermatitis due to other agents: Secondary | ICD-10-CM | POA: Diagnosis not present

## 2020-10-09 NOTE — Telephone Encounter (Signed)
Issue resolved. Pt came into the office

## 2020-10-09 NOTE — Patient Instructions (Addendum)
   Please schedule a Follow-up Appointment to: Return if symptoms worsen or fail to improve.  If you have any other questions or concerns, please feel free to call the office or send a message through MyChart. You may also schedule an earlier appointment if necessary.  Additionally, you may be receiving a survey about your experience at our office within a few days to 1 week by e-mail or mail. We value your feedback.  Felise Georgia, DO [facility], [facility] 

## 2020-10-09 NOTE — Progress Notes (Signed)
Subjective:    Patient ID: Darrell Abu Sr., male    DOB: Jun 12, 1956, 65 y.o.   MRN: [id number]  Darrell Noecker Kurihara Sr. is a 65 y.o. male presenting on 10/09/2020 for Allergic Reaction   HPI   Allergic reaction to mask [facility] Surgical Mask) Last documentation on this problem was 11/28/2019 for initial visit, patient had experienced significant allergic reaction to wearing Blue [employer] face mask at work with allergic reaction on skin and watering eyes, difficulty breathing. He has since that time been wearing N95 mask instead with better results. He does not have allergic reaction to N95. He was given doctor's note in 11/2019 to allow him to not wear Blue Surgical Face Mask. - Today he needs new documentation and FMLA completed paperwork to continue to wear the N95 mask and medical exemption to avoid wearing the [facility] Surgical Face Mask.  - He has no asthma or COPD or other breathing disorder.  Denies any cough, dyspnea, headache, rash, loss of vision blurry vision, rash, nausea vomiting  Depression screen [facility] 2/9 05/22/2020 04/26/2020 11/28/2019  Decreased Interest 0 0 0  Down, Depressed, Hopeless 0 0 0  PHQ - 2 Score 0 0 0    Social History   Tobacco Use  . Smoking status: Never Smoker  . Smokeless tobacco: Never Used  Substance Use Topics  . Alcohol use: Yes  . Drug use: No    Review of Systems Per HPI unless specifically indicated above     Objective:    Ht 5\' 11"  (1.803 m)   Wt 226 lb (102.5 kg)   BMI 31.52 kg/m   Wt Readings from Last 3 Encounters:  10/09/20 226 lb (102.5 kg)  05/22/20 226 lb (102.5 kg)  04/26/20 226 lb 6.4 oz (102.7 kg)    Physical Exam Vitals and nursing note reviewed.  Constitutional:      General: He is not in acute distress.    Appearance: He is well-developed and well-nourished. He is not diaphoretic.     Comments: Well-appearing, comfortable, cooperative  HENT:     Head: Normocephalic and atraumatic.     Mouth/Throat:     Mouth: Oropharynx  is clear and moist.  Eyes:     General:        Right eye: No discharge.        Left eye: No discharge.     Conjunctiva/sclera: Conjunctivae normal.  Cardiovascular:     Rate and Rhythm: Normal rate.  Pulmonary:     Effort: Pulmonary effort is normal.  Musculoskeletal:        General: No edema.  Skin:    General: Skin is warm and dry.     Findings: No erythema or rash.  Neurological:     Mental Status: He is alert and oriented to person, place, and time.  Psychiatric:        Mood and Affect: Mood and affect normal.        Behavior: Behavior normal.     Comments: Well groomed, good eye contact, normal speech and thoughts       Results for orders placed or performed in visit on 05/23/20  Cologuard  Result Value Ref Range   Cologuard Negative Negative      Assessment & Plan:   Problem List Items Addressed This Visit   None   Visit Diagnoses    Allergic contact dermatitis due to other agents    -  Primary      Currently he  is doing well if avoiding Blue Surgical Face Mask No acute allergic response, but if exposed he has involvement of skin dermatitis, watering eyes affecting his vision, and difficulty breathing. These symptoms would prevent him from driving and working, and are considered a physical impairment. Otherwise, he does not require any absence or leave from work, but he would need a reasonable work accommodation to not wear the [employer] and switch to N95 mask instead.  Will complete the [employer] paperwork and fax to them tomorrow on 10/10/20.  No orders of the defined types were placed in this encounter.     Follow up plan: Return if symptoms worsen or fail to improve.    [name], [facility] 10/09/2020, 9:10 AM

## 2020-10-14 ENCOUNTER — Other Ambulatory Visit: Payer: Self-pay | Admitting: Family Medicine

## 2020-10-14 DIAGNOSIS — I10 Essential (primary) hypertension: Secondary | ICD-10-CM

## 2021-02-26 ENCOUNTER — Other Ambulatory Visit: Payer: Self-pay

## 2021-02-26 DIAGNOSIS — I10 Essential (primary) hypertension: Secondary | ICD-10-CM

## 2021-02-26 MED ORDER — LISINOPRIL 40 MG PO TABS: 40.0000 mg | ORAL_TABLET | Freq: Every day | ORAL | 3 refills | Status: DC

## 2021-02-26 MED ORDER — AMLODIPINE BESYLATE 10 MG PO TABS: 1.0000 | ORAL_TABLET | Freq: Every day | ORAL | 3 refills | Status: DC

## 2021-02-26 MED ORDER — METOPROLOL SUCCINATE ER 25 MG PO TB24: 25.0000 mg | ORAL_TABLET | Freq: Every day | ORAL | 3 refills | Status: DC

## 2021-11-11 ENCOUNTER — Other Ambulatory Visit: Payer: Self-pay | Admitting: Family Medicine

## 2021-11-11 DIAGNOSIS — M109 Gout, unspecified: Secondary | ICD-10-CM

## 2021-11-11 NOTE — Telephone Encounter (Signed)
Requested medication (s) are due for refill today - expired Rx  Requested medication (s) are on the active medication list -yes  Future visit scheduled -no  Last refill: 06/07/20 #30 3RF  Notes to clinic: Attempted to call patient to schedule appointment- left message to call office. Expired Rx, fails lab protocol  Requested Prescriptions  Pending Prescriptions Disp Refills   colchicine 0.6 MG tablet [Pharmacy Med Name: Colchicine 0.6 MG Oral Tablet] 30 tablet 0    Sig: DAY 1 OF FLARE MAY TAKE 2 TABLETS FOR FIRST DOSE, THEN TAKE 1 TABLET DAILY AS NEEDED FOR FLARE UP TO 7-10 DAYS PER FLARE     Endocrinology:  Gout Agents - colchicine Failed - 11/11/2021  9:23 AM      Failed - Cr in normal range and within 360 days    Creatinine, Ser  Date Value Ref Range Status  04/24/2020 0.75 (L) 0.76 - 1.27 mg/dL Final   Creatinine, POC  Date Value Ref Range Status  09/03/2015 negative mg/dL Final          Failed - ALT in normal range and within 360 days    ALT  Date Value Ref Range Status  04/24/2020 22 0 - 44 IU/L Final          Failed - AST in normal range and within 360 days    AST  Date Value Ref Range Status  04/24/2020 19 0 - 40 IU/L Final          Failed - Valid encounter within last 12 months    Recent Outpatient Visits           1 year ago Allergic contact dermatitis due to other agents   [facility] Olin Hauser, DO   1 year ago COVID-19 virus infection   [place], Devonne Doughty, DO   1 year ago Annual physical exam   [place], DO   1 year ago Allergic contact dermatitis due to other agents   [place], Devonne Doughty, DO   3 years ago Annual physical exam   [place], DO              Failed - CBC within normal limits and completed in the last 12 months    WBC  Date Value Ref Range Status   04/24/2020 4.3 3.4 - 10.8 x10E3/uL Final   RBC  Date Value Ref Range Status  04/24/2020 4.98 4.14 - 5.80 x10E6/uL Final   Hemoglobin  Date Value Ref Range Status  04/24/2020 14.4 13.0 - 17.7 g/dL Final   Hematocrit  Date Value Ref Range Status  04/24/2020 42.5 37.5 - 51.0 % Final   MCHC  Date Value Ref Range Status  04/24/2020 33.9 31.5 - 35.7 g/dL Final   [facility]  Date Value Ref Range Status  04/24/2020 28.9 26.6 - 33.0 pg Final   MCV  Date Value Ref Range Status  04/24/2020 85 79 - 97 fL Final   No results found for: PLTCOUNTKUC, LABPLAT, POCPLA RDW  Date Value Ref Range Status  04/24/2020 12.8 11.6 - 15.4 % Final            Requested Prescriptions  Pending Prescriptions Disp Refills   colchicine 0.6 MG tablet [Pharmacy Med Name: Colchicine 0.6 MG Oral Tablet] 30 tablet 0    Sig: DAY 1 OF FLARE MAY TAKE 2 TABLETS FOR FIRST DOSE, THEN  TAKE 1 TABLET DAILY AS NEEDED FOR FLARE UP TO 7-10 DAYS PER FLARE     Endocrinology:  Gout Agents - colchicine Failed - 11/11/2021  9:23 AM      Failed - Cr in normal range and within 360 days    Creatinine, Ser  Date Value Ref Range Status  04/24/2020 0.75 (L) 0.76 - 1.27 mg/dL Final   Creatinine, POC  Date Value Ref Range Status  09/03/2015 negative mg/dL Final          Failed - ALT in normal range and within 360 days    ALT  Date Value Ref Range Status  04/24/2020 22 0 - 44 IU/L Final          Failed - AST in normal range and within 360 days    AST  Date Value Ref Range Status  04/24/2020 19 0 - 40 IU/L Final          Failed - Valid encounter within last 12 months    Recent Outpatient Visits           1 year ago Allergic contact dermatitis due to other agents   [facility] Olin Hauser, DO   1 year ago COVID-19 virus infection   [place], Devonne Doughty, DO   1 year ago Annual physical exam   [place], DO   1  year ago Allergic contact dermatitis due to other agents   [place], Devonne Doughty, DO   3 years ago Annual physical exam   [place], DO              Failed - CBC within normal limits and completed in the last 12 months    WBC  Date Value Ref Range Status  04/24/2020 4.3 3.4 - 10.8 x10E3/uL Final   RBC  Date Value Ref Range Status  04/24/2020 4.98 4.14 - 5.80 x10E6/uL Final   Hemoglobin  Date Value Ref Range Status  04/24/2020 14.4 13.0 - 17.7 g/dL Final   Hematocrit  Date Value Ref Range Status  04/24/2020 42.5 37.5 - 51.0 % Final   MCHC  Date Value Ref Range Status  04/24/2020 33.9 31.5 - 35.7 g/dL Final   [facility]  Date Value Ref Range Status  04/24/2020 28.9 26.6 - 33.0 pg Final   MCV  Date Value Ref Range Status  04/24/2020 85 79 - 97 fL Final   No results found for: PLTCOUNTKUC, LABPLAT, POCPLA RDW  Date Value Ref Range Status  04/24/2020 12.8 11.6 - 15.4 % Final

## 2022-01-13 ENCOUNTER — Other Ambulatory Visit: Payer: Self-pay | Admitting: Family Medicine

## 2022-01-13 DIAGNOSIS — I10 Essential (primary) hypertension: Secondary | ICD-10-CM

## 2022-01-13 DIAGNOSIS — E782 Mixed hyperlipidemia: Secondary | ICD-10-CM

## 2022-01-14 NOTE — Telephone Encounter (Signed)
Call to patient- advised Rx request is coming up with appointment/lab advisory. Patient declines to schedule at this time- states he will call back next week. Patient states he is not completely out of his medications for BP yet.  ?Rx- BP medications- 02/26/21 #90 3RF ?Lipid-04/26/20 #90 3RF ?Requested Prescriptions  ?Pending Prescriptions Disp Refills  ?? lisinopril (ZESTRIL) 40 MG tablet [Pharmacy Med Name: Lisinopril 40 MG Oral Tablet] 90 tablet 3  ?  Sig: TAKE 1 TABLET BY MOUTH  DAILY  ?  ? Cardiovascular:  ACE Inhibitors Failed - 01/13/2022  8:05 AM  ?  ?  Failed - Cr in normal range and within 180 days  ?  Creatinine, Ser  ?Date Value Ref Range Status  ?04/24/2020 0.75 (L) 0.76 - 1.27 mg/dL Final  ? ?Creatinine, POC  ?Date Value Ref Range Status  ?09/03/2015 negative mg/dL Final  ?   ?  ?  Failed - K in normal range and within 180 days  ?  Potassium  ?Date Value Ref Range Status  ?04/24/2020 3.9 3.5 - 5.2 mmol/L Final  ?   ?  ?  Failed - Last BP in normal range  ?  BP Readings from Last 1 Encounters:  ?05/26/20 (!) 165/89  ?   ?  ?  Failed - Valid encounter within last 6 months  ?  Recent Outpatient Visits   ?      ? 1 year ago Allergic contact dermatitis due to other agents  ? [place], DO  ? 1 year ago COVID-19 virus infection  ? [place], DO  ? 1 year ago Annual physical exam  ? [place], DO  ? 2 years ago Allergic contact dermatitis due to other agents  ? [place], DO  ? 3 years ago Annual physical exam  ? [place], DO  ?  ?  ? ?  ?  ?  Passed - Patient is not pregnant  ?  ?  ?? rosuvastatin (CRESTOR) 10 MG tablet [Pharmacy Med Name: Rosuvastatin Calcium 10 MG Oral Tablet] 90 tablet 3  ?  Sig: TAKE 1 TABLET BY MOUTH AT  BEDTIME  ?  ? Cardiovascular:  Antilipid - Statins 2 Failed - 01/13/2022   8:05 AM  ?  ?  Failed - Cr in normal range and within 360 days  ?  Creatinine, Ser  ?Date Value Ref Range Status  ?04/24/2020 0.75 (L) 0.76 - 1.27 mg/dL Final  ? ?Creatinine, POC  ?Date Value Ref Range Status  ?09/03/2015 negative mg/dL Final  ?   ?  ?  Failed - Valid encounter within last 12 months  ?  Recent Outpatient Visits   ?      ? 1 year ago Allergic contact dermatitis due to other agents  ? [place], DO  ? 1 year ago COVID-19 virus infection  ? [place], DO  ? 1 year ago Annual physical exam  ? [place], DO  ? 2 years ago Allergic contact dermatitis due to other agents  ? [place], DO  ? 3 years ago Annual physical exam  ? [place], DO  ?  ?  ? ?  ?  ?  Failed -  Lipid Panel in normal range within the last 12 months  ?  Cholesterol, Total  ?Date Value Ref Range Status  ?04/24/2020 243 (H) 100 - 199 mg/dL Final  ? ?LDL Chol Calc (NIH)  ?Date Value Ref Range Status  ?04/24/2020 154 (H) 0 - 99 mg/dL Final  ? ?HDL  ?Date Value Ref Range Status  ?04/24/2020 68 >39 mg/dL Final  ? ?Triglycerides  ?Date Value Ref Range Status  ?04/24/2020 122 0 - 149 mg/dL Final  ? ?  ?  ?  Passed - Patient is not pregnant  ?  ?  ?? amLODipine (NORVASC) 10 MG tablet [Pharmacy Med Name: amLODIPine Besylate 10 MG Oral Tablet] 90 tablet 3  ?  Sig: TAKE 1 TABLET BY MOUTH  DAILY FOR BLOOD PRESSURE  ?  ? Cardiovascular: Calcium Channel Blockers 2 Failed - 01/13/2022  8:05 AM  ?  ?  Failed - Last BP in normal range  ?  BP Readings from Last 1 Encounters:  ?05/26/20 (!) 165/89  ?   ?  ?  Failed - Valid encounter within last 6 months  ?  Recent Outpatient Visits   ?      ? 1 year ago Allergic contact dermatitis due to other agents  ? [place], DO  ? 1 year ago COVID-19 virus  infection  ? [place], DO  ? 1 year ago Annual physical exam  ? [place], DO  ? 2 years ago Allergic contact dermatitis due to other agents  ? [place], DO  ? 3 years ago Annual physical exam  ? [place], DO  ?  ?  ? ?  ?  ?  Passed - Last Heart Rate in normal range  ?  Pulse Readings from Last 1 Encounters:  ?05/26/20 88  ?   ?  ?  ?? metoprolol succinate (TOPROL-XL) 25 MG 24 hr tablet [Pharmacy Med Name: Metoprolol Succinate ER 25 MG Oral Tablet Extended Release 24 Hour] 90 tablet 3  ?  Sig: TAKE 1 TABLET BY MOUTH  DAILY  ?  ? Cardiovascular:  Beta Blockers Failed - 01/13/2022  8:05 AM  ?  ?  Failed - Last BP in normal range  ?  BP Readings from Last 1 Encounters:  ?05/26/20 (!) 165/89  ?   ?  ?  Failed - Valid encounter within last 6 months  ?  Recent Outpatient Visits   ?      ? 1 year ago Allergic contact dermatitis due to other agents  ? [place], DO  ? 1 year ago COVID-19 virus infection  ? [place], DO  ? 1 year ago Annual physical exam  ? [place], DO  ? 2 years ago Allergic contact dermatitis due to other agents  ? [place], DO  ? 3 years ago Annual physical exam  ? [place], DO  ?  ?  ? ?  ?  ?  Passed - Last Heart Rate in normal range  ?  Pulse Readings from Last 1 Encounters:  ?05/26/20 88  ?   ?  ?  ?  ?

## 2022-01-14 NOTE — Telephone Encounter (Signed)
Requested medication (s) are due for refill today- expired Rx ? ?Requested medication (s) are on the active medication list -yes ? ?Future visit scheduled -no ? ?Last refill: 04/26/20 #90 3RF ? ?Notes to clinic: Call to patient- patient declines to make appointment- states he will call back next week. Request sent for review  ? ?Requested Prescriptions  ?Pending Prescriptions Disp Refills  ? rosuvastatin (CRESTOR) 10 MG tablet [Pharmacy Med Name: Rosuvastatin Calcium 10 MG Oral Tablet] 90 tablet 3  ?  Sig: TAKE 1 TABLET BY MOUTH AT  BEDTIME  ?  ? Cardiovascular:  Antilipid - Statins 2 Failed - 01/13/2022  8:05 AM  ?  ?  Failed - Cr in normal range and within 360 days  ?  Creatinine, Ser  ?Date Value Ref Range Status  ?04/24/2020 0.75 (L) 0.76 - 1.27 mg/dL Final  ? ?Creatinine, POC  ?Date Value Ref Range Status  ?09/03/2015 negative mg/dL Final  ?  ?  ?  ?  Failed - Valid encounter within last 12 months  ?  Recent Outpatient Visits   ? ?      ? 1 year ago Allergic contact dermatitis due to other agents  ? [place], DO  ? 1 year ago COVID-19 virus infection  ? [place], DO  ? 1 year ago Annual physical exam  ? [place], DO  ? 2 years ago Allergic contact dermatitis due to other agents  ? [place], DO  ? 3 years ago Annual physical exam  ? [place], DO  ? ?  ?  ? ? ?  ?  ?  Failed - Lipid Panel in normal range within the last 12 months  ?  Cholesterol, Total  ?Date Value Ref Range Status  ?04/24/2020 243 (H) 100 - 199 mg/dL Final  ? ?LDL Chol Calc (NIH)  ?Date Value Ref Range Status  ?04/24/2020 154 (H) 0 - 99 mg/dL Final  ? ?HDL  ?Date Value Ref Range Status  ?04/24/2020 68 >39 mg/dL Final  ? ?Triglycerides  ?Date Value Ref Range Status  ?04/24/2020 122 0 - 149 mg/dL Final  ? ?  ?  ?  Passed - Patient is  not pregnant  ?  ?  ?Refused Prescriptions Disp Refills  ? lisinopril (ZESTRIL) 40 MG tablet [Pharmacy Med Name: Lisinopril 40 MG Oral Tablet] 90 tablet 3  ?  Sig: TAKE 1 TABLET BY MOUTH  DAILY  ?  ? Cardiovascular:  ACE Inhibitors Failed - 01/13/2022  8:05 AM  ?  ?  Failed - Cr in normal range and within 180 days  ?  Creatinine, Ser  ?Date Value Ref Range Status  ?04/24/2020 0.75 (L) 0.76 - 1.27 mg/dL Final  ? ?Creatinine, POC  ?Date Value Ref Range Status  ?09/03/2015 negative mg/dL Final  ?  ?  ?  ?  Failed - K in normal range and within 180 days  ?  Potassium  ?Date Value Ref Range Status  ?04/24/2020 3.9 3.5 - 5.2 mmol/L Final  ?  ?  ?  ?  Failed - Last BP in normal range  ?  BP Readings from Last 1 Encounters:  ?05/26/20 (!) 165/89  ?  ?  ?  ?  Failed - Valid encounter within last 6 months  ?  Recent Outpatient Visits   ? ?      ?  1 year ago Allergic contact dermatitis due to other agents  ? [place], DO  ? 1 year ago COVID-19 virus infection  ? [employer] [place], DO  ? 1 year ago Annual physical exam  ? [place], DO  ? 2 years ago Allergic contact dermatitis due to other agents  ? [place], DO  ? 3 years ago Annual physical exam  ? [place], DO  ? ?  ?  ? ? ?  ?  ?  Passed - Patient is not pregnant  ?  ?  ? amLODipine (NORVASC) 10 MG tablet [Pharmacy Med Name: amLODIPine Besylate 10 MG Oral Tablet] 90 tablet 3  ?  Sig: TAKE 1 TABLET BY MOUTH  DAILY FOR BLOOD PRESSURE  ?  ? Cardiovascular: Calcium Channel Blockers 2 Failed - 01/13/2022  8:05 AM  ?  ?  Failed - Last BP in normal range  ?  BP Readings from Last 1 Encounters:  ?05/26/20 (!) 165/89  ?  ?  ?  ?  Failed - Valid encounter within last 6 months  ?  Recent Outpatient Visits   ? ?      ? 1 year ago Allergic contact dermatitis due to other agents  ?  [place], DO  ? 1 year ago COVID-19 virus infection  ? [place], DO  ? 1 year ago Annual physical exam  ? [place], DO  ? 2 years ago Allergic contact dermatitis due to other agents  ? [place], DO  ? 3 years ago Annual physical exam  ? [place], DO  ? ?  ?  ? ? ?  ?  ?  Passed - Last Heart Rate in normal range  ?  Pulse Readings from Last 1 Encounters:  ?05/26/20 88  ?  ?  ?  ?  ? metoprolol succinate (TOPROL-XL) 25 MG 24 hr tablet [Pharmacy Med Name: Metoprolol Succinate ER 25 MG Oral Tablet Extended Release 24 Hour] 90 tablet 3  ?  Sig: TAKE 1 TABLET BY MOUTH  DAILY  ?  ? Cardiovascular:  Beta Blockers Failed - 01/13/2022  8:05 AM  ?  ?  Failed - Last BP in normal range  ?  BP Readings from Last 1 Encounters:  ?05/26/20 (!) 165/89  ?  ?  ?  ?  Failed - Valid encounter within last 6 months  ?  Recent Outpatient Visits   ? ?      ? 1 year ago Allergic contact dermatitis due to other agents  ? [place], DO  ? 1 year ago COVID-19 virus infection  ? [place], DO  ? 1 year ago Annual physical exam  ? [place], DO  ? 2 years ago Allergic contact dermatitis due to other agents  ? [place], DO  ? 3 years ago Annual physical exam  ? [place], DO  ? ?  ?  ? ? ?  ?  ?  Passed - Last Heart Rate in normal range  ?  Pulse Readings from Last  1 Encounters:  ?05/26/20 88  ?  ?  ?  ?  ? ? ? ?Requested Prescriptions  ?Pending Prescriptions Disp Refills  ? rosuvastatin (CRESTOR) 10 MG tablet [Pharmacy Med Name: Rosuvastatin Calcium 10 MG Oral Tablet] 90 tablet 3  ?  Sig: TAKE 1 TABLET BY MOUTH AT   BEDTIME  ?  ? Cardiovascular:  Antilipid - Statins 2 Failed - 01/13/2022  8:05 AM  ?  ?  Failed - Cr in normal range and within 360 days  ?  Creatinine, Ser  ?Date Value Ref Range Status  ?04/24/2020 0.75 (L) 0.76 - 1.27 mg/dL Final  ? ?Creatinine, POC  ?Date Value Ref Range Status  ?09/03/2015 negative mg/dL Final  ?  ?  ?  ?  Failed - Valid encounter within last 12 months  ?  Recent Outpatient Visits   ? ?      ? 1 year ago Allergic contact dermatitis due to other agents  ? [place], DO  ? 1 year ago COVID-19 virus infection  ? [place], DO  ? 1 year ago Annual physical exam  ? [place], DO  ? 2 years ago Allergic contact dermatitis due to other agents  ? [place], DO  ? 3 years ago Annual physical exam  ? [place], DO  ? ?  ?  ? ? ?  ?  ?  Failed - Lipid Panel in normal range within the last 12 months  ?  Cholesterol, Total  ?Date Value Ref Range Status  ?04/24/2020 243 (H) 100 - 199 mg/dL Final  ? ?LDL Chol Calc (NIH)  ?Date Value Ref Range Status  ?04/24/2020 154 (H) 0 - 99 mg/dL Final  ? ?HDL  ?Date Value Ref Range Status  ?04/24/2020 68 >39 mg/dL Final  ? ?Triglycerides  ?Date Value Ref Range Status  ?04/24/2020 122 0 - 149 mg/dL Final  ? ?  ?  ?  Passed - Patient is not pregnant  ?  ?  ?Refused Prescriptions Disp Refills  ? lisinopril (ZESTRIL) 40 MG tablet [Pharmacy Med Name: Lisinopril 40 MG Oral Tablet] 90 tablet 3  ?  Sig: TAKE 1 TABLET BY MOUTH  DAILY  ?  ? Cardiovascular:  ACE Inhibitors Failed - 01/13/2022  8:05 AM  ?  ?  Failed - Cr in normal range and within 180 days  ?  Creatinine, Ser  ?Date Value Ref Range Status  ?04/24/2020 0.75 (L) 0.76 - 1.27 mg/dL Final  ? ?Creatinine, POC  ?Date Value Ref Range Status  ?09/03/2015 negative mg/dL Final  ?  ?  ?  ?  Failed - K in normal range  and within 180 days  ?  Potassium  ?Date Value Ref Range Status  ?04/24/2020 3.9 3.5 - 5.2 mmol/L Final  ?  ?  ?  ?  Failed - Last BP in normal range  ?  BP Readings from Last 1 Encounters:  ?05/26/20 (!) 16

## 2022-01-22 ENCOUNTER — Encounter: Payer: Self-pay | Admitting: Family Medicine

## 2022-01-22 ENCOUNTER — Ambulatory Visit (INDEPENDENT_AMBULATORY_CARE_PROVIDER_SITE_OTHER): Admitting: Family Medicine

## 2022-01-22 VITALS — BP 138/88 | HR 60 | Ht 71.0 in | Wt 232.6 lb

## 2022-01-22 DIAGNOSIS — I10 Essential (primary) hypertension: Secondary | ICD-10-CM | POA: Diagnosis not present

## 2022-01-22 DIAGNOSIS — M1A09X0 Idiopathic chronic gout, multiple sites, without tophus (tophi): Secondary | ICD-10-CM

## 2022-01-22 DIAGNOSIS — R3914 Feeling of incomplete bladder emptying: Secondary | ICD-10-CM

## 2022-01-22 DIAGNOSIS — J3089 Other allergic rhinitis: Secondary | ICD-10-CM | POA: Diagnosis not present

## 2022-01-22 DIAGNOSIS — R7303 Prediabetes: Secondary | ICD-10-CM | POA: Diagnosis not present

## 2022-01-22 DIAGNOSIS — N401 Enlarged prostate with lower urinary tract symptoms: Secondary | ICD-10-CM

## 2022-01-22 DIAGNOSIS — E669 Obesity, unspecified: Secondary | ICD-10-CM

## 2022-01-22 DIAGNOSIS — E782 Mixed hyperlipidemia: Secondary | ICD-10-CM

## 2022-01-22 MED ORDER — AMLODIPINE BESYLATE 10 MG PO TABS: 10.0000 mg | ORAL_TABLET | Freq: Every day | ORAL | 3 refills | Status: AC

## 2022-01-22 MED ORDER — METOPROLOL SUCCINATE ER 25 MG PO TB24: 25.0000 mg | ORAL_TABLET | Freq: Every day | ORAL | 3 refills | Status: AC

## 2022-01-22 MED ORDER — LISINOPRIL 40 MG PO TABS: 40.0000 mg | ORAL_TABLET | Freq: Every day | ORAL | 3 refills | Status: AC

## 2022-01-22 MED ORDER — FLUTICASONE PROPIONATE 50 MCG/ACT NA SUSP: 2.0000 | Freq: Every day | NASAL | 3 refills | Status: AC

## 2022-01-22 MED ORDER — ROSUVASTATIN CALCIUM 10 MG PO TABS: 10.0000 mg | ORAL_TABLET | Freq: Every day | ORAL | 3 refills | Status: AC

## 2022-01-22 NOTE — Assessment & Plan Note (Signed)
Recheck lipids ?Continue statin ?The 10-year ASCVD risk score (Arnett DK, et al., 2019) is: 17.2% ? ?Plan: ?1. Continue current meds - Rosuvastatin '10mg'$  ?2. Encourage improved lifestyle - low carb/cholesterol, reduce portion size, continue improving regular exercise ?

## 2022-01-22 NOTE — Progress Notes (Signed)
? ?Subjective:  ? ? Patient ID: Darrell HYLAND Sr., male    DOB: 05-05-1956, 66 y.o.   MRN: [id number] ? ?Darrell Mawson Mahnke Sr. is a 66 y.o. male presenting on 01/22/2022 for Hypertension and Hyperlipidemia ? ? ?HPI ? ?Pre-Diabetes / Obesity BMI >32 ?Still has elevated A1c at 6.4 on last lab ?CBGs: Not checking sugar ?Meds: None, off metformin ?Currently on ACEi ?Lifestyle: ?- Diet (not adhering to low carb diet, he plans to restart healthier diet)  ?- Exercise (limited activity, now more sedentary) ?Denies hypoglycemia ?  ?Chronic Gout ?No recent gout flares. ?Uric Acid improved to 7.6 (04/2020), previously 8.0 ? Not on prophylaxis ?Colchicine PRN ?  ?CHRONIC HTN: ?Reports no new concern. Improved control ?Current Meds - Lisinopril '40mg'$  daily, Amlodipine '10mg'$  daily, Metoprolol XL '25mg'$  ?OUT of Amlodipine ?Reports good compliance, took meds today. Tolerating well, w/o complaints. ?Denies CP, dyspnea, HA, edema, dizziness / lightheadedness ?  ?HYPERLIPIDEMIA: ?- Reports no concern ?On Rosuvastatin. Due for lipids ?  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 ?Mild symptoms currently ?On Sildenafil PRN ? ? ?Health Maintenance: ? ?UTD Cologuard 2021, next due 8/;2024 ? ? ?  01/22/2022  ?  8:27 AM 05/22/2020  ?  1:19 PM 04/26/2020  ?  1:56 PM  ?Depression screen PHQ 2/9  ?Decreased Interest 0 0 0  ?Down, Depressed, Hopeless 0 0 0  ?PHQ - 2 Score 0 0 0  ?Altered sleeping 0    ?Tired, decreased energy 0    ?Change in appetite 0    ?Feeling bad or failure about yourself  0    ?Trouble concentrating 0    ?Moving slowly or fidgety/restless 0    ?Suicidal thoughts 0    ?PHQ-9 Score 0    ?Difficult doing work/chores Not difficult at all    ? ? ?Social History  ? ?Tobacco Use  ? Smoking status: Never  ? Smokeless tobacco: Never  ?Vaping Use  ? Vaping Use: Never used  ?Substance Use Topics  ? Alcohol use: Yes  ? Drug use: No  ? ? ?Review of Systems ?Per HPI unless specifically indicated above ? ?   ?Objective:  ?   ?BP 138/88 (BP Location: Left Arm, Cuff Size: Normal)   Pulse 60   Ht '5\' 11"'$  (1.803 m)   Wt 232 lb 9.6 oz (105.5 kg)   SpO2 98%   BMI 32.44 kg/m?   ?Wt Readings from Last 3 Encounters:  ?01/22/22 232 lb 9.6 oz (105.5 kg)  ?10/09/20 226 lb (102.5 kg)  ?05/22/20 226 lb (102.5 kg)  ?  ?Physical Exam ?Vitals and nursing note reviewed.  ?Constitutional:   ?   General: He is not in acute distress. ?   Appearance: He is well-developed. He is obese. He is not diaphoretic.  ?   Comments: Well-appearing, comfortable, cooperative  ?HENT:  ?   Head: Normocephalic and atraumatic.  ?Eyes:  ?   General:     ?   Right eye: No discharge.     ?   Left eye: No discharge.  ?   Conjunctiva/sclera: Conjunctivae normal.  ?Neck:  ?   Thyroid: No thyromegaly.  ?Cardiovascular:  ?   Rate and Rhythm: Normal rate and regular rhythm.  ?   Pulses: Normal pulses.  ?   Heart sounds: Normal heart sounds. No murmur heard. ?Pulmonary:  ?   Effort: Pulmonary effort is normal. No respiratory distress.  ?   Breath sounds: Normal breath sounds.  No wheezing or rales.  ?Musculoskeletal:     ?   General: Normal range of motion.  ?   Cervical back: Normal range of motion and neck supple.  ?Lymphadenopathy:  ?   Cervical: No cervical adenopathy.  ?Skin: ?   General: Skin is warm and dry.  ?   Findings: No erythema or rash.  ?Neurological:  ?   Mental Status: He is alert and oriented to person, place, and time. Mental status is at baseline.  ?Psychiatric:     ?   Behavior: Behavior normal.  ?   Comments: Well groomed, good eye contact, normal speech and thoughts  ? ?Results for orders placed or performed in visit on 05/23/20  ?Cologuard  ?Result Value Ref Range  ? Cologuard Negative Negative  ? ?   ?Assessment & Plan:  ? ?Problem List Items Addressed This Visit   ? ? Resistant hypertension - Primary  ?  Elevated BP repeat manual improved, out of amlodipine today ?Non adherence to some meds, and limited lifestyle ?- Home BP readings limited readings but  will use new cuff now by his report  ?Previously on HCTZ, however seems may have been correlated with more gout flares more, now off med ? ?  ?Plan:  ?1 Continue Amlodipine '10mg'$  daily, Lisinopril '40mg'$  daily, Metoprolol XL '25mg'$  daily ?2. Encourage improved lifestyle - low sodium diet, regular exercise ?3. Continue monitor BP outside office, bring readings to next visit, if persistently >140/90 or new symptoms notify office sooner ? ?Future consider other options ? ?  ?  ? Relevant Medications  ? amLODipine (NORVASC) 10 MG tablet  ? lisinopril (ZESTRIL) 40 MG tablet  ? metoprolol succinate (TOPROL-XL) 25 MG 24 hr tablet  ? rosuvastatin (CRESTOR) 10 MG tablet  ? Other Relevant Orders  ? CBC with Differential/Platelet  ? Comprehensive metabolic panel  ? Pre-diabetes  ?  Elevated A1c up to 6.4 persistent from last time with poorly controlled Pre-DM - prior readings 6.0-6.2 ?Concern with obesity, HTN, HLD ? ?Plan:  ?1. Discussion on medication vs lifestyle, will pursue lifestyle management now. ?2. Encourage improved lifestyle - low carb, low sugar diet, reduce portion size, continue improving regular exercise ? ?Labs ? ?  ?  ? Relevant Orders  ? Hemoglobin A1c  ? Obesity (BMI 30.0-34.9)  ? Relevant Orders  ? CBC with Differential/Platelet  ? Lipid panel  ? Comprehensive metabolic panel  ? Hyperlipidemia  ?  Recheck lipids ?Continue statin ?The 10-year ASCVD risk score (Arnett DK, et al., 2019) is: 17.2% ? ?Plan: ?1. Continue current meds - Rosuvastatin '10mg'$  ?2. Encourage improved lifestyle - low carb/cholesterol, reduce portion size, continue improving regular exercise ?  ?  ? Relevant Medications  ? amLODipine (NORVASC) 10 MG tablet  ? lisinopril (ZESTRIL) 40 MG tablet  ? metoprolol succinate (TOPROL-XL) 25 MG 24 hr tablet  ? rosuvastatin (CRESTOR) 10 MG tablet  ? Other Relevant Orders  ? Lipid panel  ? Comprehensive metabolic panel  ? TSH  ? Benign prostatic hyperplasia with incomplete bladder emptying  ?  Stable,  clinically with BPH, with lower urinary tract symptoms (LUTS) ?- No prior treatment ?Prior PSA negative ?- Last DRE years ago normal reported ?- No known personal/family history of prostate CA ? ?Plan: ?May consider alpha blocker for both BPH and HTN  ?  ?  ? Relevant Orders  ? PSA  ? ?Other Visit Diagnoses   ? ? Environmental and seasonal allergies      ?  Relevant Medications  ? fluticasone (FLONASE) 50 MCG/ACT nasal spray  ? Chronic gout of multiple sites, unspecified cause      ? Relevant Orders  ? Uric acid  ? ?  ?  ?Updated Health Maintenance information ?Reviewed recent lab results with patient ?Encouraged improvement to lifestyle with diet and exercise ?Goal of weight loss ? ? ?Orders Placed This Encounter  ?Procedures  ? CBC with Differential/Platelet  ? Lipid panel  ?  Order Specific Question:   Has the patient fasted?  ?  Answer:   Yes  ? Hemoglobin A1c  ? PSA  ? Comprehensive metabolic panel  ?  Order Specific Question:   Has the patient fasted?  ?  Answer:   Yes  ? Uric acid  ? TSH  ? ? ? ?Meds ordered this encounter  ?Medications  ? fluticasone (FLONASE) 50 MCG/ACT nasal spray  ?  Sig: Place 2 sprays into both nostrils daily. Use for 4-6 weeks then stop and use seasonally or as needed.  ?  Dispense:  16 g  ?  Refill:  3  ? amLODipine (NORVASC) 10 MG tablet  ?  Sig: Take 1 tablet (10 mg total) by mouth daily. for blood pressure  ?  Dispense:  90 tablet  ?  Refill:  3  ?  Requesting 1 year supply  ? lisinopril (ZESTRIL) 40 MG tablet  ?  Sig: Take 1 tablet (40 mg total) by mouth daily.  ?  Dispense:  90 tablet  ?  Refill:  3  ?  Requesting 1 year supply  ? metoprolol succinate (TOPROL-XL) 25 MG 24 hr tablet  ?  Sig: Take 1 tablet (25 mg total) by mouth daily.  ?  Dispense:  90 tablet  ?  Refill:  3  ? rosuvastatin (CRESTOR) 10 MG tablet  ?  Sig: Take 1 tablet (10 mg total) by mouth at bedtime.  ?  Dispense:  90 tablet  ?  Refill:  3  ?  Add extra refills on file  ? ? ? ?Follow up plan: ?Return in about 1  year (around 01/23/2023) for 1 year Annual Physical [facility] orders after. ? ? ?[name], DO ?[facility] ?[facility] ?01/22/2022, 8:10 AM ?

## 2022-01-22 NOTE — Assessment & Plan Note (Signed)
Elevated BP repeat manual improved, out of amlodipine today ?Non adherence to some meds, and limited lifestyle ?- Home BP readings limited readings but will use new cuff now by his report  ?Previously on HCTZ, however seems may have been correlated with more gout flares more, now off med ? ?  ?Plan:  ?1 Continue Amlodipine '10mg'$  daily, Lisinopril '40mg'$  daily, Metoprolol XL '25mg'$  daily ?2. Encourage improved lifestyle - low sodium diet, regular exercise ?3. Continue monitor BP outside office, bring readings to next visit, if persistently >140/90 or new symptoms notify office sooner ? ?Future consider other options ?

## 2022-01-22 NOTE — Patient Instructions (Addendum)
Thank you for coming to the office today. ? ?Allergy Eye Drops  -only as needed ?OTC SYSTANE? ZADITOR? Antihistamine Eye Drops - [facility] ? ?Rx ?Start nasal steroid Flonase 2 sprays in each nostril daily for 4-6 weeks, may repeat course seasonally or as needed ? ?Take a Claritin daily ? ?Refilled all meds to [facility] ? ?Labs for [facility] ? ?Please schedule a Follow-up Appointment to: Return in about 1 year (around 01/23/2023) for 1 year Annual Physical [facility] orders after. ? ?If you have any other questions or concerns, please feel free to call the office or send a message through [facility]. You may also schedule an earlier appointment if necessary. ? ?Additionally, you may be receiving a survey about your experience at our office within a few days to 1 week by e-mail or mail. We value your feedback. ? ?[name], DO ?[place] ?

## 2022-01-22 NOTE — Assessment & Plan Note (Addendum)
Elevated A1c up to 6.4 persistent from last time with poorly controlled Pre-DM - prior readings 6.0-6.2 ?Concern with obesity, HTN, HLD ? ?Plan:  ?1. Discussion on medication vs lifestyle, will pursue lifestyle management now. ?2. Encourage improved lifestyle - low carb, low sugar diet, reduce portion size, continue improving regular exercise ? ?Labs ?

## 2022-01-22 NOTE — Assessment & Plan Note (Signed)
Stable, clinically with BPH, with lower urinary tract symptoms (LUTS) ?- No prior treatment ?Prior PSA negative ?- Last DRE years ago normal reported ?- No known personal/family history of prostate CA ? ?Plan: ?May consider alpha blocker for both BPH and HTN  ?

## 2022-01-28 LAB — CBC WITH DIFFERENTIAL/PLATELET
Basophils Absolute: 0.1 10*3/uL (ref 0.0–0.2)
Basos: 1 %
EOS (ABSOLUTE): 0.4 10*3/uL (ref 0.0–0.4)
Eos: 8 %
Hematocrit: 44 % (ref 37.5–51.0)
Hemoglobin: 14.7 g/dL (ref 13.0–17.7)
Immature Grans (Abs): 0 10*3/uL (ref 0.0–0.1)
Immature Granulocytes: 0 %
Lymphocytes Absolute: 2.2 10*3/uL (ref 0.7–3.1)
Lymphs: 48 %
MCH: 29 pg (ref 26.6–33.0)
MCHC: 33.4 g/dL (ref 31.5–35.7)
MCV: 87 fL (ref 79–97)
Monocytes Absolute: 0.5 10*3/uL (ref 0.1–0.9)
Monocytes: 10 %
Neutrophils Absolute: 1.6 10*3/uL (ref 1.4–7.0)
Neutrophils: 33 %
Platelets: 397 10*3/uL (ref 150–450)
RBC: 5.07 x10E6/uL (ref 4.14–5.80)
RDW: 13 % (ref 11.6–15.4)
WBC: 4.8 10*3/uL (ref 3.4–10.8)

## 2022-01-28 LAB — HEMOGLOBIN A1C
Est. average glucose Bld gHb Est-mCnc: 143 mg/dL
Hgb A1c MFr Bld: 6.6 % — ABNORMAL HIGH (ref 4.8–5.6)

## 2022-01-28 LAB — COMPREHENSIVE METABOLIC PANEL
ALT: 27 IU/L (ref 0–44)
AST: 22 IU/L (ref 0–40)
Albumin/Globulin Ratio: 1.6 (ref 1.2–2.2)
Albumin: 4.4 g/dL (ref 3.8–4.8)
Alkaline Phosphatase: 54 IU/L (ref 44–121)
BUN/Creatinine Ratio: 14 (ref 10–24)
BUN: 13 mg/dL (ref 8–27)
Bilirubin Total: 0.3 mg/dL (ref 0.0–1.2)
CO2: 26 mmol/L (ref 20–29)
Calcium: 9.6 mg/dL (ref 8.6–10.2)
Chloride: 101 mmol/L (ref 96–106)
Creatinine, Ser: 0.94 mg/dL (ref 0.76–1.27)
Globulin, Total: 2.7 g/dL (ref 1.5–4.5)
Glucose: 135 mg/dL — ABNORMAL HIGH (ref 70–99)
Potassium: 4.1 mmol/L (ref 3.5–5.2)
Sodium: 142 mmol/L (ref 134–144)
Total Protein: 7.1 g/dL (ref 6.0–8.5)
eGFR: 91 mL/min/{1.73_m2} (ref >59)

## 2022-01-28 LAB — LIPID PANEL
Chol/HDL Ratio: 4.1 ratio (ref 0.0–5.0)
Cholesterol, Total: 243 mg/dL — ABNORMAL HIGH (ref 100–199)
HDL: 60 mg/dL (ref >39)
LDL Chol Calc (NIH): 156 mg/dL — ABNORMAL HIGH (ref 0–99)
Triglycerides: 153 mg/dL — ABNORMAL HIGH (ref 0–149)
VLDL Cholesterol Cal: 27 mg/dL (ref 5–40)

## 2022-01-28 LAB — PSA: Prostate Specific Ag, Serum: 2.2 ng/mL (ref 0.0–4.0)

## 2022-01-28 LAB — TSH: TSH: 1.72 u[IU]/mL (ref 0.450–4.500)

## 2022-01-28 LAB — URIC ACID: Uric Acid: 9.9 mg/dL — ABNORMAL HIGH (ref 3.8–8.4)

## 2022-05-08 ENCOUNTER — Ambulatory Visit: Payer: Self-pay | Admitting: *Deleted

## 2022-05-08 DIAGNOSIS — M109 Gout, unspecified: Secondary | ICD-10-CM

## 2022-05-08 MED ORDER — COLCHICINE 0.6 MG PO TABS: ORAL_TABLET | ORAL | 0 refills | Status: DC

## 2022-05-08 NOTE — Telephone Encounter (Signed)
Summary: medication question   Pt has some gout medication that has expired.  He wants to know if he can still use it.   CB@ [phone]     Patient reports he is having a gout flare in his wrist- patient states the colchicine he has expired 2/23- he does not have RF. Patient advised do not take expired medication- will send request to provider for review.  Answer Assessment - Initial Assessment Questions 1. DRUG NAME: "What medicine do you need to have refilled?"     Colchicine - Rx has expired 2. REFILLS REMAINING: "How many refills are remaining?" (Note: The label on the medicine or pill bottle will show how many refills are remaining. If there are no refills remaining, then a renewal may be needed.)     none 3. EXPIRATION DATE: "What is the expiration date?" (Note: The label states when the prescription will expire, and thus can no longer be refilled.)     2/23 4. PRESCRIBING HCP: "Who prescribed it?" Reason: If prescribed by specialist, call should be referred to that group.     PCP 5. SYMPTOMS: "Do you have any symptoms?"     Wrist pain/swelling  Protocols used: Medication Refill and Renewal Call-A-AH

## 2022-05-08 NOTE — Telephone Encounter (Signed)
Requested Prescriptions  Pending Prescriptions Disp Refills  . colchicine 0.6 MG tablet 30 tablet 0    Sig: DAY 1 OF FLARE MAY TAKE 2 TABLETS FOR FIRST DOSE, THEN TAKE 1 TABLET DAILY AS NEEDED FOR FLARE UP TO 7-10 DAYS PER FLARE     Endocrinology:  Gout Agents - colchicine Passed - 05/08/2022  3:34 PM      Passed - Cr in normal range and within 360 days    Creatinine, Ser  Date Value Ref Range Status  01/27/2022 0.94 0.76 - 1.27 mg/dL Final   Creatinine, POC  Date Value Ref Range Status  09/03/2015 negative mg/dL Final         Passed - ALT in normal range and within 360 days    ALT  Date Value Ref Range Status  01/27/2022 27 0 - 44 IU/L Final         Passed - AST in normal range and within 360 days    AST  Date Value Ref Range Status  01/27/2022 22 0 - 40 IU/L Final         Passed - Valid encounter within last 12 months    Recent Outpatient Visits          3 months ago Resistant hypertension   [facility] Olin Hauser, DO   1 year ago Allergic contact dermatitis due to other agents   [facility] Olin Hauser, DO   1 year ago COVID-19 virus infection   [place], Devonne Doughty, DO   2 years ago Annual physical exam   [place], DO   2 years ago Allergic contact dermatitis due to other agents   [place], DO             Passed - CBC within normal limits and completed in the last 12 months    WBC  Date Value Ref Range Status  01/27/2022 4.8 3.4 - 10.8 x10E3/uL Final   RBC  Date Value Ref Range Status  01/27/2022 5.07 4.14 - 5.80 x10E6/uL Final   Hemoglobin  Date Value Ref Range Status  01/27/2022 14.7 13.0 - 17.7 g/dL Final   Hematocrit  Date Value Ref Range Status  01/27/2022 44.0 37.5 - 51.0 % Final   MCHC  Date Value Ref Range Status  01/27/2022 33.4 31.5 - 35.7 g/dL Final   [facility]   Date Value Ref Range Status  01/27/2022 29.0 26.6 - 33.0 pg Final   MCV  Date Value Ref Range Status  01/27/2022 87 79 - 97 fL Final   No results found for: "PLTCOUNTKUC", "LABPLAT", "POCPLA" RDW  Date Value Ref Range Status  01/27/2022 13.0 11.6 - 15.4 % Final

## 2022-06-16 ENCOUNTER — Other Ambulatory Visit: Payer: Self-pay | Admitting: Family Medicine

## 2022-06-16 DIAGNOSIS — M109 Gout, unspecified: Secondary | ICD-10-CM

## 2022-06-16 NOTE — Telephone Encounter (Signed)
Requested Prescriptions  Pending Prescriptions Disp Refills  . colchicine 0.6 MG tablet [Pharmacy Med Name: COLCHICINE 0.6 MG TABLET] 30 tablet 0    Sig: DAY 1 OF FLARE MAY TAKE 2 TABLETS FOR FIRST DOSE, THEN TAKE 1 TABLET DAILY AS NEEDED FOR FLARE UP TO 7-10 DAYS PER FLARE     Endocrinology:  Gout Agents - colchicine Passed - 06/16/2022 12:36 PM      Passed - Cr in normal range and within 360 days    Creatinine, Ser  Date Value Ref Range Status  01/27/2022 0.94 0.76 - 1.27 mg/dL Final   Creatinine, POC  Date Value Ref Range Status  09/03/2015 negative mg/dL Final         Passed - ALT in normal range and within 360 days    ALT  Date Value Ref Range Status  01/27/2022 27 0 - 44 IU/L Final         Passed - AST in normal range and within 360 days    AST  Date Value Ref Range Status  01/27/2022 22 0 - 40 IU/L Final         Passed - Valid encounter within last 12 months    Recent Outpatient Visits          4 months ago Resistant hypertension   [place], DO   1 year ago Allergic contact dermatitis due to other agents   [facility] Olin Hauser, DO   2 years ago COVID-56 virus infection   [place], Devonne Doughty, DO   2 years ago Annual physical exam   [place], DO   2 years ago Allergic contact dermatitis due to other agents   [place], DO             Passed - CBC within normal limits and completed in the last 12 months    WBC  Date Value Ref Range Status  01/27/2022 4.8 3.4 - 10.8 x10E3/uL Final   RBC  Date Value Ref Range Status  01/27/2022 5.07 4.14 - 5.80 x10E6/uL Final   Hemoglobin  Date Value Ref Range Status  01/27/2022 14.7 13.0 - 17.7 g/dL Final   Hematocrit  Date Value Ref Range Status  01/27/2022 44.0 37.5 - 51.0 % Final   MCHC  Date Value Ref Range Status   01/27/2022 33.4 31.5 - 35.7 g/dL Final   [facility]  Date Value Ref Range Status  01/27/2022 29.0 26.6 - 33.0 pg Final   MCV  Date Value Ref Range Status  01/27/2022 87 79 - 97 fL Final   No results found for: "PLTCOUNTKUC", "LABPLAT", "POCPLA" RDW  Date Value Ref Range Status  01/27/2022 13.0 11.6 - 15.4 % Final

## 2023-09-03 ENCOUNTER — Ambulatory Visit
Admission: EM | Admit: 2023-09-03 | Discharge: 2023-09-03 | Disposition: A | Discharge status: Home / Self Care | Payer: Non-veteran care

## 2023-09-03 DIAGNOSIS — M79601 Pain in right arm: Secondary | ICD-10-CM

## 2023-09-03 MED ORDER — MELOXICAM 7.5 MG PO TABS: 7.5000 mg | ORAL_TABLET | Freq: Every day | ORAL | 0 refills | Status: DC

## 2023-09-03 MED ORDER — MELOXICAM 7.5 MG PO TABS: 7.5000 mg | ORAL_TABLET | Freq: Every day | ORAL | 0 refills | Status: AC

## 2023-09-03 NOTE — Discharge Instructions (Addendum)
Take the meloxicam daily for 10 days.  Follow-up with your primary care provider.

## 2023-09-03 NOTE — ED Provider Notes (Signed)
Renaldo Fiddler    CSN: [id number] Arrival date & time: 09/03/23  1455      History   Chief Complaint Chief Complaint  Patient presents with   Arm Injury    HPI Darrell Bradney Pho Sr. is a 67 y.o. male.  Patient presents with right arm pain, primarily in his upper arm x 2 days.  The pain is worse with raising his arm or trying to reach behind his head, improves with rest.  No trauma.  Treatment attempted with naproxen and Ace wrap.  No wounds, bruising, redness, swelling, numbness, weakness.  His medical history includes hypertension, diabetes, gout, osteoarthritis.  The history is provided by the patient and medical records.    Past Medical History:  Diagnosis Date   Allergy    Gout    Hypertension    Thrombocytosis     Patient Active Problem List   Diagnosis Date Noted   Benign prostatic hyperplasia with incomplete bladder emptying 05/27/2018   Suspected sleep apnea 05/19/2017   Hyperlipidemia 09/07/2016   Erectile dysfunction 09/07/2016   Obesity (BMI 30.0-34.9) 06/24/2016   Osteoarthritis of left knee 06/24/2016   Chronic pain of left knee 06/24/2016   Exposure to hepatitis C 10/24/2015   Tinea pedis 09/03/2015   Resistant hypertension 07/10/2015   Pre-diabetes 07/10/2015    History reviewed. No pertinent surgical history.     Home Medications    Prior to Admission medications   Medication Sig Start Date End Date Taking? Authorizing Provider  meloxicam (MOBIC) 7.5 MG tablet Take 1 tablet (7.5 mg total) by mouth daily for 10 days. 09/03/23 09/13/23 Yes Mickie Bail, NP  metFORMIN (GLUCOPHAGE) 500 MG tablet Take 500 mg by mouth. 09/22/22  Yes [provider]  amLODipine (NORVASC) 10 MG tablet Take 1 tablet (10 mg total) by mouth daily. for blood pressure 01/22/22   Karamalegos, Netta Neat, DO  aspirin EC 81 MG tablet Take 1 tablet (81 mg total) by mouth daily. 09/07/16   Karamalegos, Alexander J, DO  colchicine 0.6 MG tablet DAY 1 OF FLARE MAY  TAKE 2 TABLETS FOR FIRST DOSE, THEN TAKE 1 TABLET DAILY AS NEEDED FOR FLARE UP TO 7-10 DAYS PER FLARE 06/16/22   Karamalegos, Alexander J, DO  fluticasone (FLONASE) 50 MCG/ACT nasal spray Place 2 sprays into both nostrils daily. Use for 4-6 weeks then stop and use seasonally or as needed. 01/22/22   Karamalegos, Netta Neat, DO  lisinopril (ZESTRIL) 40 MG tablet Take 1 tablet (40 mg total) by mouth daily. 01/22/22   Karamalegos, Netta Neat, DO  metoprolol succinate (TOPROL-XL) 25 MG 24 hr tablet Take 1 tablet (25 mg total) by mouth daily. 01/22/22   Karamalegos, Netta Neat, DO  ONE TOUCH ULTRA TEST test strip  09/17/15   [provider]  rosuvastatin (CRESTOR) 10 MG tablet Take 1 tablet (10 mg total) by mouth at bedtime. 01/22/22   Karamalegos, Netta Neat, DO  sildenafil (REVATIO) 20 MG tablet Take 1-5 pills about 30 min prior to sex. Start with 1 and increase as needed. 05/27/18   Karamalegos, Netta Neat, DO    Family History Family History  Problem Relation Age of Onset   Diabetes Mother    Prostate cancer Neg Hx    Colon cancer Neg Hx     Social History Social History   Tobacco Use   Smoking status: Never   Smokeless tobacco: Never  Vaping Use   Vaping status: Never Used  Substance Use Topics  Alcohol use: Yes   Drug use: No     Allergies   Patient has no known allergies.   Review of Systems Review of Systems  Constitutional:  Negative for chills and fever.  Musculoskeletal:  Positive for arthralgias and myalgias. Negative for joint swelling.  Skin:  Negative for color change, rash and wound.  Neurological:  Negative for weakness and numbness.     Physical Exam Triage Vital Signs ED Triage Vitals [09/03/23 1522]  Encounter Vitals Group     BP 133/78     Systolic BP Percentile      Diastolic BP Percentile      Pulse Rate 90     Resp 18     Temp 97.7 F (36.5 C)     Temp src      SpO2 95 %     Weight      Height      Head Circumference      Peak Flow       Pain Score      Pain Loc      Pain Education      Exclude from Growth Chart    No data found.  Updated Vital Signs BP 133/78   Pulse 90   Temp 97.7 F (36.5 C)   Resp 18   SpO2 95%   Visual Acuity Right Eye Distance:   Left Eye Distance:   Bilateral Distance:    Right Eye Near:   Left Eye Near:    Bilateral Near:     Physical Exam Constitutional:      General: He is not in acute distress. HENT:     Mouth/Throat:     Mouth: Mucous membranes are moist.  Cardiovascular:     Rate and Rhythm: Normal rate and regular rhythm.  Pulmonary:     Effort: Pulmonary effort is normal. No respiratory distress.  Musculoskeletal:        General: Tenderness present. No swelling or deformity.     Comments: Mild tenderness to palpation of muscles from forearm to shoulder.  Decreased ROM in shoulder due to discomfort.  FROM in elbow.  Sensation intact.  Strength 5/5.  2+ radial pulse.  Skin:    General: Skin is warm and dry.     Capillary Refill: Capillary refill takes less than 2 seconds.     Findings: No bruising, erythema, lesion or rash.  Neurological:     General: No focal deficit present.     Mental Status: He is alert and oriented to person, place, and time.     Sensory: No sensory deficit.     Motor: No weakness.      UC Treatments / Results  Labs (all labs ordered are listed, but only abnormal results are displayed) Labs Reviewed - No data to display  EKG   Radiology No results found.  Procedures Procedures (including critical care time)  Medications Ordered in UC Medications - No data to display  Initial Impression / Assessment and Plan / UC Course  I have reviewed the triage vital signs and the nursing notes.  Pertinent labs & imaging results that were available during my care of the patient were reviewed by me and considered in my medical decision making (see chart for details).    Right arm pain.  Afebrile and vital signs are stable.  No trauma.   Treating with meloxicam.  Education provided on musculoskeletal pain.  Instructed patient to follow-up with his PCP.  He agrees to plan  of care.  Final Clinical Impressions(s) / UC Diagnoses   Final diagnoses:  Right arm pain     Discharge Instructions      Take the meloxicam daily for 10 days.  Follow-up with your primary care provider.     ED Prescriptions     Medication Sig Dispense Auth. Provider   meloxicam (MOBIC) 7.5 MG tablet Take 1 tablet (7.5 mg total) by mouth daily for 10 days. 10 tablet Mickie Bail, NP      I have reviewed the PDMP during this encounter.   Mickie Bail, NP 09/03/23 1556

## 2023-09-03 NOTE — ED Triage Notes (Addendum)
Patient to Urgent Care with complaints of right sided, upper arm pain (bicep). Reports his entire arm feels sore/ throbbing pain. Difficulty with ROM. Denies any known injury/ pushing or lifting anything heavy.  Symptoms started two days ago. Arrives with compression dressing in place. Taking naproxen.   No hx of injury.

## 2023-09-24 ENCOUNTER — Ambulatory Visit: Admitting: Family Medicine

## 2023-10-09 ENCOUNTER — Encounter: Payer: Self-pay | Admitting: *Deleted

## 2023-10-09 ENCOUNTER — Ambulatory Visit
Admission: EM | Admit: 2023-10-09 | Discharge: 2023-10-09 | Disposition: A | Discharge status: Home / Self Care | Payer: Non-veteran care | Attending: Emergency Medicine | Admitting: Emergency Medicine

## 2023-10-09 DIAGNOSIS — M10021 Idiopathic gout, right elbow: Secondary | ICD-10-CM | POA: Diagnosis not present

## 2023-10-09 MED ORDER — COLCHICINE 0.6 MG PO TABS: ORAL_TABLET | ORAL | 0 refills | Status: AC

## 2023-10-09 NOTE — ED Triage Notes (Signed)
Patient states gout flare since yesterday in right elbow.  He was recently prescribed 7 colchicine tabs but his flare usually lasts a few days and he doesn't have any more.

## 2023-10-09 NOTE — ED Provider Notes (Signed)
Darrell Jackson    CSN: [id number] Arrival date & time: 10/09/23  1057      History   Chief Complaint Chief Complaint  Patient presents with   Gout    HPI Darrell Saldutti Langhans Sr. is a 68 y.o. male.   Presents for evaluation of right elbow pain and swelling beginning 1 day ago.  Symptoms began after consumption of alcohol which is a known trigger for his gout.  Had prescription for colchicine provided by his PCP but only had 2 tablets left which she took yesterday evening.  Denies injury or trauma.  Has full range of motion but pain is exacerbated when arm is lifted above the head.   Past Medical History:  Diagnosis Date   Allergy    Gout    Hypertension    Thrombocytosis     Patient Active Problem List   Diagnosis Date Noted   Benign prostatic hyperplasia with incomplete bladder emptying 05/27/2018   Suspected sleep apnea 05/19/2017   Hyperlipidemia 09/07/2016   Erectile dysfunction 09/07/2016   Obesity (BMI 30.0-34.9) 06/24/2016   Osteoarthritis of left knee 06/24/2016   Chronic pain of left knee 06/24/2016   Exposure to hepatitis C 10/24/2015   Tinea pedis 09/03/2015   Resistant hypertension 07/10/2015   Pre-diabetes 07/10/2015    History reviewed. No pertinent surgical history.     Home Medications    Prior to Admission medications   Medication Sig Start Date End Date Taking? Authorizing Provider  colchicine 0.6 MG tablet Take 1.2 mg (2 tablets ) when pain starts then in 1 hour take 0.6 mg (1 tablet), then take daily for up to 7 days 10/09/23  Yes Justinn Welter R, NP  amLODipine (NORVASC) 10 MG tablet Take 1 tablet (10 mg total) by mouth daily. for blood pressure 01/22/22   Karamalegos, Netta Neat, DO  aspirin EC 81 MG tablet Take 1 tablet (81 mg total) by mouth daily. 09/07/16   Karamalegos, Alexander J, DO  colchicine 0.6 MG tablet DAY 1 OF FLARE MAY TAKE 2 TABLETS FOR FIRST DOSE, THEN TAKE 1 TABLET DAILY AS NEEDED FOR FLARE UP TO 7-10 DAYS PER FLARE  06/16/22   Karamalegos, Alexander J, DO  fluticasone (FLONASE) 50 MCG/ACT nasal spray Place 2 sprays into both nostrils daily. Use for 4-6 weeks then stop and use seasonally or as needed. 01/22/22   Karamalegos, Netta Neat, DO  lisinopril (ZESTRIL) 40 MG tablet Take 1 tablet (40 mg total) by mouth daily. 01/22/22   Karamalegos, Netta Neat, DO  metFORMIN (GLUCOPHAGE) 500 MG tablet Take 500 mg by mouth. 09/22/22   [provider]  metoprolol succinate (TOPROL-XL) 25 MG 24 hr tablet Take 1 tablet (25 mg total) by mouth daily. 01/22/22   Karamalegos, Netta Neat, DO  ONE TOUCH ULTRA TEST test strip  09/17/15   [provider]  rosuvastatin (CRESTOR) 10 MG tablet Take 1 tablet (10 mg total) by mouth at bedtime. 01/22/22   Karamalegos, Netta Neat, DO  sildenafil (REVATIO) 20 MG tablet Take 1-5 pills about 30 min prior to sex. Start with 1 and increase as needed. 05/27/18   Smitty Cords, DO    Family History Family History  Problem Relation Age of Onset   Diabetes Mother    Prostate cancer Neg Hx    Colon cancer Neg Hx     Social History Social History   Tobacco Use   Smoking status: Never   Smokeless tobacco: Never  Vaping Use  Vaping status: Never Used  Substance Use Topics   Alcohol use: Yes    Comment: daily beer   Drug use: No     Allergies   Spironolactone   Review of Systems Review of Systems   Physical Exam Triage Vital Signs ED Triage Vitals  Encounter Vitals Group     BP 10/09/23 1124 (!) 147/78     Systolic BP Percentile --      Diastolic BP Percentile --      Pulse Rate 10/09/23 1124 91     Resp 10/09/23 1124 18     Temp 10/09/23 1124 99.6 F (37.6 C)     Temp Source 10/09/23 1124 Oral     SpO2 10/09/23 1124 95 %     Weight 10/09/23 1121 224 lb (101.6 kg)     Height 10/09/23 1121 5\' 10"  (1.778 m)     Head Circumference --      Peak Flow --      Pain Score 10/09/23 1120 8     Pain Loc --      Pain Education --      Exclude from  Growth Chart --    No data found.  Updated Vital Signs BP (!) 147/78 (BP Location: Left Arm)   Pulse 91   Temp 99.6 F (37.6 C) (Oral)   Resp 18   Ht 5\' 10"  (1.778 m)   Wt 224 lb (101.6 kg)   SpO2 95%   BMI 32.14 kg/m   Visual Acuity Right Eye Distance:   Left Eye Distance:   Bilateral Distance:    Right Eye Near:   Left Eye Near:    Bilateral Near:     Physical Exam Constitutional:      Appearance: Normal appearance.  Eyes:     Extraocular Movements: Extraocular movements intact.  Musculoskeletal:     Comments: Mild to moderate swelling, generalized tenderness present to the right elbow, no point tenderness noted, 2+ brachial pulse, has full range of motion  Neurological:     Mental Status: He is alert and oriented to person, place, and time.      UC Treatments / Results  Labs (all labs ordered are listed, but only abnormal results are displayed) Labs Reviewed - No data to display  EKG   Radiology No results found.  Procedures Procedures (including critical care time)  Medications Ordered in UC Medications - No data to display  Initial Impression / Assessment and Plan / UC Course  I have reviewed the triage vital signs and the nursing notes.  Pertinent labs & imaging results that were available during my care of the patient were reviewed by me and considered in my medical decision making (see chart for details).  Acute idiopathic gout of right elbow  Presentation and symptomology consistent with above diagnosis, known history, prescribed colchicine and discussed administration as well as supportive measures, may follow-up with urgent care as needed Final Clinical Impressions(s) / UC Diagnoses   Final diagnoses:  Acute idiopathic gout of right elbow     Discharge Instructions      Your evaluated for pain and swelling to your elbow which is most consistent with her gout as you have had in the past  Take colchicine as directed to reduce  symptoms  May use ice or heat over the affected area 10 to 15-minute intervals  May elevate whenever sitting or lying to help reduce swelling  May follow-up with urgent care as needed if symptoms persist or  worsen   ED Prescriptions     Medication Sig Dispense Auth. Provider   colchicine 0.6 MG tablet Take 1.2 mg (2 tablets ) when pain starts then in 1 hour take 0.6 mg (1 tablet), then take daily for up to 7 days 18 tablet Yosgar Demirjian, Elita Boone, NP      PDMP not reviewed this encounter.   Valinda Hoar, NP 10/09/23 1152

## 2023-10-09 NOTE — Discharge Instructions (Signed)
Your evaluated for pain and swelling to your elbow which is most consistent with her gout as you have had in the past  Take colchicine as directed to reduce symptoms  May use ice or heat over the affected area 10 to 15-minute intervals  May elevate whenever sitting or lying to help reduce swelling  May follow-up with urgent care as needed if symptoms persist or worsen

## 2023-11-30 ENCOUNTER — Other Ambulatory Visit: Payer: Self-pay | Admitting: Physical Medicine and Rehabilitation

## 2023-11-30 DIAGNOSIS — M1A9XX1 Chronic gout, unspecified, with tophus (tophi): Secondary | ICD-10-CM

## 2023-12-14 ENCOUNTER — Ambulatory Visit
Admission: RE | Admit: 2023-12-14 | Discharge: 2023-12-14 | Disposition: A | Discharge status: Home / Self Care | Attending: Physical Medicine and Rehabilitation | Admitting: Physical Medicine and Rehabilitation

## 2023-12-14 ENCOUNTER — Ambulatory Visit
Admission: RE | Admit: 2023-12-14 | Discharge: 2023-12-14 | Disposition: A | Discharge status: Home / Self Care | Source: Ambulatory Visit | Attending: Physical Medicine and Rehabilitation | Admitting: Physical Medicine and Rehabilitation

## 2023-12-14 DIAGNOSIS — M1A9XX1 Chronic gout, unspecified, with tophus (tophi): Secondary | ICD-10-CM | POA: Diagnosis present

## 2024-05-31 ENCOUNTER — Ambulatory Visit
Admission: EM | Admit: 2024-05-31 | Discharge: 2024-05-31 | Disposition: A | Discharge status: Home / Self Care | Attending: Emergency Medicine | Admitting: Emergency Medicine

## 2024-05-31 ENCOUNTER — Emergency Department
Admission: EM | Admit: 2024-05-31 | Discharge: 2024-05-31 | Disposition: A | Discharge status: Home / Self Care | Attending: Emergency Medicine | Admitting: Emergency Medicine

## 2024-05-31 ENCOUNTER — Other Ambulatory Visit: Payer: Self-pay

## 2024-05-31 DIAGNOSIS — I10 Essential (primary) hypertension: Secondary | ICD-10-CM | POA: Insufficient documentation

## 2024-05-31 DIAGNOSIS — T783XXA Angioneurotic edema, initial encounter: Secondary | ICD-10-CM | POA: Diagnosis not present

## 2024-05-31 DIAGNOSIS — R600 Localized edema: Secondary | ICD-10-CM | POA: Diagnosis present

## 2024-05-31 DIAGNOSIS — J069 Acute upper respiratory infection, unspecified: Secondary | ICD-10-CM

## 2024-05-31 DIAGNOSIS — Z79899 Other long term (current) drug therapy: Secondary | ICD-10-CM | POA: Diagnosis not present

## 2024-05-31 LAB — BASIC METABOLIC PANEL WITH GFR
Anion gap: 12 (ref 5–15)
BUN: 13 mg/dL (ref 8–23)
CO2: 24 mmol/L (ref 22–32)
Calcium: 8.9 mg/dL (ref 8.9–10.3)
Chloride: 103 mmol/L (ref 98–111)
Creatinine, Ser: 0.77 mg/dL (ref 0.61–1.24)
GFR, Estimated: >60 mL/min (ref >60)
Glucose, Bld: 107 mg/dL — ABNORMAL HIGH (ref 70–99)
Potassium: 3.3 mmol/L — ABNORMAL LOW (ref 3.5–5.1)
Sodium: 139 mmol/L (ref 135–145)

## 2024-05-31 LAB — POC SOFIA SARS ANTIGEN FIA: SARS Coronavirus 2 Ag: NEGATIVE

## 2024-05-31 LAB — CBC WITH DIFFERENTIAL/PLATELET
Abs Immature Granulocytes: 0.02 K/uL (ref 0.00–0.07)
Basophils Absolute: 0 K/uL (ref 0.0–0.1)
Basophils Relative: 1 %
Eosinophils Absolute: 0.3 K/uL (ref 0.0–0.5)
Eosinophils Relative: 4 %
HCT: 43.2 % (ref 39.0–52.0)
Hemoglobin: 14.4 g/dL (ref 13.0–17.0)
Immature Granulocytes: 0 %
Lymphocytes Relative: 34 %
Lymphs Abs: 2.5 K/uL (ref 0.7–4.0)
MCH: 28.7 pg (ref 26.0–34.0)
MCHC: 33.3 g/dL (ref 30.0–36.0)
MCV: 86.2 fL (ref 80.0–100.0)
Monocytes Absolute: 0.7 K/uL (ref 0.1–1.0)
Monocytes Relative: 9 %
Neutro Abs: 3.8 K/uL (ref 1.7–7.7)
Neutrophils Relative %: 52 %
Platelets: 393 K/uL (ref 150–400)
RBC: 5.01 MIL/uL (ref 4.22–5.81)
RDW: 13.7 % (ref 11.5–15.5)
WBC: 7.3 K/uL (ref 4.0–10.5)
nRBC: 0 % (ref 0.0–0.2)

## 2024-05-31 MED ORDER — BENZONATATE 100 MG PO CAPS: 100.0000 mg | ORAL_CAPSULE | Freq: Three times a day (TID) | ORAL | 0 refills | Status: AC | PRN

## 2024-05-31 MED ORDER — BENZONATATE 100 MG PO CAPS: 100.0000 mg | ORAL_CAPSULE | Freq: Three times a day (TID) | ORAL | 0 refills | Status: DC | PRN

## 2024-05-31 MED ORDER — DIPHENHYDRAMINE HCL 50 MG/ML IJ SOLN
25.0000 mg | Freq: Once | INTRAMUSCULAR | Status: AC
  Administered 2024-05-31: 25 mg via INTRAVENOUS
  Filled 2024-05-31: qty 1

## 2024-05-31 MED ORDER — FAMOTIDINE IN NACL 20-0.9 MG/50ML-% IV SOLN
20.0000 mg | Freq: Once | INTRAVENOUS | Status: AC
  Administered 2024-05-31: 20 mg via INTRAVENOUS
  Filled 2024-05-31: qty 50

## 2024-05-31 MED ORDER — EPINEPHRINE 0.3 MG/0.3ML IJ SOAJ: 0.3000 mg | INTRAMUSCULAR | 1 refills | Status: DC | PRN

## 2024-05-31 MED ORDER — PREDNISONE 20 MG PO TABS
60.0000 mg | ORAL_TABLET | Freq: Every day | ORAL | 0 refills | Status: DC
Start: 2024-06-01 — End: 2024-05-31

## 2024-05-31 MED ORDER — METHYLPREDNISOLONE SODIUM SUCC 125 MG IJ SOLR
125.0000 mg | Freq: Once | INTRAMUSCULAR | Status: AC
  Administered 2024-05-31: 125 mg via INTRAVENOUS
  Filled 2024-05-31: qty 2

## 2024-05-31 MED ORDER — EPINEPHRINE 0.3 MG/0.3ML IJ SOAJ
0.3000 mg | Freq: Once | INTRAMUSCULAR | Status: AC
  Administered 2024-05-31: 0.3 mg via INTRAMUSCULAR
  Filled 2024-05-31: qty 0.3

## 2024-05-31 MED ORDER — PREDNISONE 20 MG PO TABS: 60.0000 mg | ORAL_TABLET | Freq: Every day | ORAL | 0 refills | Status: AC

## 2024-05-31 MED ORDER — EPINEPHRINE 0.3 MG/0.3ML IJ SOAJ: 0.3000 mg | INTRAMUSCULAR | 1 refills | Status: AC | PRN

## 2024-05-31 NOTE — ED Triage Notes (Addendum)
 Pt arrives via POV with c/o an allergic reaction to BP meds. Pt took 10mg  Amlodipine , 25mg  Metoprolol , 40 mg lisinopril  taken around 0800 this morning. Pt went to urgent care because they thought they were coming down with covid. Pt's face is swollen on the right cheek, their lips are swollen. Pt denies SOB, CP, trouble swallowing. Pt is A&Ox4 and ambulatory during triage.   Pt has had allergic reaction to medication and their face was swollen.

## 2024-05-31 NOTE — ED Triage Notes (Signed)
 Pt states that he has a cough, nasal congestion, and back pain. X2 days  Pt states that he has taken Nyquil.

## 2024-05-31 NOTE — Discharge Instructions (Addendum)
 Your blood pressure is elevated today at 170/82; repeat 163/87.  Please have this rechecked by your primary care provider.      Your COVID test is negative.  Take the Tessalon  Perles as directed for cough.  Take Tylenol as needed for fever or discomfort.  Follow up with your primary care provider tomorrow.  Go to the emergency department if you have worsening symptoms.

## 2024-05-31 NOTE — Discharge Instructions (Addendum)
 Please stop taking your lisinopril  and follow-up with your primary care doctor to see if they can make any medications adjustments for your hypertension.  Please take 4 more days of steroids, I have also prescribed you an EpiPen  with 1 refill.

## 2024-05-31 NOTE — ED Provider Notes (Signed)
 [facility] Provider Note    Event Date/Time   First MD Initiated Contact with Patient 05/31/24 1640     (approximate)   History   Allergic Reaction   HPI  Darrell Bohorquez Rabanal Sr. is a 68 y.o. male with history of hypertension, allergies, presenting with lip swelling.  Patient started having some lip swelling at urgent care office today, he denies any tongue swelling or shortness of breath, no wheezing or hives, no nausea vomiting or diarrhea.  Was at urgent care for some cough and congestion.  States that his COVID was negative, he was about to be discharge to pick up some cough medications when he noticed the symptoms.  States that this happened in the past when he was on spironolactone for which he was taken off the spironolactone.  Did take lisinopril  today.  On independent chart review, he was at urgent care today for your upper URI symptoms.  His COVID was negative.     Physical Exam   Triage Vital Signs: ED Triage Vitals  Encounter Vitals Group     BP --      Girls Systolic BP Percentile --      Girls Diastolic BP Percentile --      Boys Systolic BP Percentile --      Boys Diastolic BP Percentile --      Pulse --      Resp --      Temp --      Temp src --      SpO2 --      Weight 05/31/24 1635 260 lb (117.9 kg)     Height 05/31/24 1635 5' 10 (1.778 m)     Head Circumference --      Peak Flow --      Pain Score 05/31/24 1633 0     Pain Loc --      Pain Education --      Exclude from Growth Chart --     Most recent vital signs: Vitals:   05/31/24 2000 05/31/24 2033  BP: (!) 167/88   Pulse: 78 79  Resp: (!) 32 20  Temp:    SpO2: 93% 99%     General: Awake, no distress.  CV:  Good peripheral perfusion.  Resp:  Normal effort.  Clear Abd:  No distention.  Soft nontender Other:  No obvious rash, clear oropharynx, his upper and lower lips are edematous and swollen.  No voice hoarseness, full range of motion of neck is intact.   ED  Results / Procedures / Treatments   Labs (all labs ordered are listed, but only abnormal results are displayed) Labs Reviewed  BASIC METABOLIC PANEL WITH GFR - Abnormal; Notable for the following components:      Result Value   Potassium 3.3 (*)    Glucose, Bld 107 (*)    All other components within normal limits  CBC WITH DIFFERENTIAL/PLATELET      PROCEDURES:  Critical Care performed: Yes, see critical care procedure note(s)  .Critical Care  Performed by: Waymond Lorelle Cummins, MD Authorized by: Waymond Lorelle Cummins, MD   Critical care provider statement:    Critical care time (minutes):  40   Critical care was necessary to treat or prevent imminent or life-threatening deterioration of the following conditions: Angioedema.   Critical care was time spent personally by me on the following activities:  Development of treatment plan with patient or surrogate, discussions with consultants, evaluation of patient's response  to treatment, examination of patient, ordering and review of laboratory studies, ordering and review of radiographic studies, ordering and performing treatments and interventions, pulse oximetry, re-evaluation of patient's condition and review of old charts    MEDICATIONS ORDERED IN ED: Medications  diphenhydrAMINE  (BENADRYL ) injection 25 mg (25 mg Intravenous Given 05/31/24 1705)  famotidine  (PEPCID ) IVPB 20 mg premix (0 mg Intravenous Stopped 05/31/24 1756)  methylPREDNISolone  sodium succinate (SOLU-MEDROL ) 125 mg/2 mL injection 125 mg (125 mg Intravenous Given 05/31/24 1705)  EPINEPHrine  (EPI-PEN) injection 0.3 mg (0.3 mg Intramuscular Given 05/31/24 1703)     IMPRESSION / MDM / ASSESSMENT AND PLAN / ED COURSE  I reviewed the triage vital signs and the nursing notes.                              Differential diagnosis includes, but is not limited to, angioedema, anaphylaxis, allergies, medication reaction.  Will given some IM epi, Benadryl , Pepcid , Solu-Medrol .  Get basic  labs and plan observe him in the emergency department.  Patient's presentation is most consistent with acute presentation with potential threat to life or bodily function.  Independent interpretation of labs below.  Patient after multiple reassessments with improvement to his angioedema to his lips.  Discussed with him about outpatient follow-up, instructed him to stop taking his lisinopril , prescribed 4 more days of prednisone , also EpiPen  with 1 refill.  On last reassessment, respiratory rate was 20, satting 97% on room air.  He safe for outpatient management.  Considered but no indication for inpatient admission at this time, he safe for outpatient management.  Strict return precautions given.  Shared decision making to patient he is agreeable with this plan.  The patient is on the cardiac monitor to evaluate for evidence of arrhythmia and/or significant heart rate changes.   Clinical Course as of 05/31/24 2035  Wed May 31, 2024  1734 Independent review of labs, no leukocytosis, electrolytes not severely deranged, creatinine is normal. [TT]  1801 On reassessment patient's lip swelling is improving significantly.  He denies any tongue swelling or shortness of breath at this time.  Will monitor him for another 2 hours for a total of 3 hours after epi.  Will give him 4 more days of steroids that he can take at home as well as prescription for EpiPen .  Discussed with him and wife that he should stop taking lisinopril  and to follow-up with his primary care doctor to get his medications adjusted. [TT]    Clinical Course User Index [TT] Waymond, Lorelle Cummins, MD     FINAL CLINICAL IMPRESSION(S) / ED DIAGNOSES   Final diagnoses:  Angioedema, initial encounter     Rx / DC Orders   ED Discharge Orders          Ordered    predniSONE  (DELTASONE ) 20 MG tablet  Daily,   Status:  Discontinued        05/31/24 2025    predniSONE  (DELTASONE ) 20 MG tablet  Daily        05/31/24 2026    EPINEPHrine  0.3  mg/0.3 mL IJ SOAJ injection  As needed,   Status:  Discontinued        05/31/24 2025    EPINEPHrine  0.3 mg/0.3 mL IJ SOAJ injection  As needed        05/31/24 2026             Note:  This document was prepared using Dragon voice recognition  software and may include unintentional dictation errors.    Waymond Lorelle Cummins, MD 05/31/24 [phone]

## 2024-05-31 NOTE — ED Provider Notes (Signed)
 [name]    CSN: [id number] Arrival date & time: 05/31/24  1029      History   Chief Complaint Chief Complaint  Patient presents with   Cough    HPI Darrell Brister Debellis Sr. is a 68 y.o. male.  Patient presents with 2-day history of congestion and cough.  No fever, chest pain, shortness of breath, vomiting, diarrhea.  He took NyQuil last night.  No OTC medications taken today.  His wife has similar symptoms.  His medical history includes resistant hypertension.  The history is provided by the patient and medical records.    Past Medical History:  Diagnosis Date   Allergy    Gout    Hypertension    Thrombocytosis     Patient Active Problem List   Diagnosis Date Noted   Benign prostatic hyperplasia with incomplete bladder emptying 05/27/2018   Suspected sleep apnea 05/19/2017   Hyperlipidemia 09/07/2016   Erectile dysfunction 09/07/2016   Obesity (BMI 30.0-34.9) 06/24/2016   Osteoarthritis of left knee 06/24/2016   Chronic pain of left knee 06/24/2016   Exposure to hepatitis C 10/24/2015   Tinea pedis 09/03/2015   Resistant hypertension 07/10/2015   Pre-diabetes 07/10/2015    History reviewed. No pertinent surgical history.     Home Medications    Prior to Admission medications   Medication Sig Start Date End Date Taking? Authorizing Provider  amLODipine  (NORVASC ) 10 MG tablet Take 1 tablet (10 mg total) by mouth daily. for blood pressure 01/22/22  Yes Karamalegos, Marsa PARAS, DO  aspirin  EC 81 MG tablet Take 1 tablet (81 mg total) by mouth daily. 09/07/16  Yes Karamalegos, Alexander J, DO  colchicine  0.6 MG tablet DAY 1 OF FLARE MAY TAKE 2 TABLETS FOR FIRST DOSE, THEN TAKE 1 TABLET DAILY AS NEEDED FOR FLARE UP TO 7-10 DAYS PER FLARE 06/16/22  Yes Karamalegos, Marsa PARAS, DO  colchicine  0.6 MG tablet Take 1.2 mg (2 tablets ) when pain starts then in 1 hour take 0.6 mg (1 tablet), then take daily for up to 7 days 10/09/23  Yes White, Adrienne R, NP   fluticasone  (FLONASE ) 50 MCG/ACT nasal spray Place 2 sprays into both nostrils daily. Use for 4-6 weeks then stop and use seasonally or as needed. 01/22/22  Yes Karamalegos, Marsa PARAS, DO  lisinopril  (ZESTRIL ) 40 MG tablet Take 1 tablet (40 mg total) by mouth daily. 01/22/22  Yes Karamalegos, Marsa PARAS, DO  metFORMIN  (GLUCOPHAGE ) 500 MG tablet Take 500 mg by mouth. 09/22/22  Yes [provider]  metoprolol  succinate (TOPROL -XL) 25 MG 24 hr tablet Take 1 tablet (25 mg total) by mouth daily. 01/22/22  Yes Karamalegos, Marsa PARAS, DO  ONE TOUCH ULTRA TEST test strip  09/17/15  Yes [provider]  rosuvastatin  (CRESTOR ) 10 MG tablet Take 1 tablet (10 mg total) by mouth at bedtime. 01/22/22  Yes Karamalegos, Marsa PARAS, DO  sildenafil  (REVATIO ) 20 MG tablet Take 1-5 pills about 30 min prior to sex. Start with 1 and increase as needed. 05/27/18  Yes Karamalegos, Marsa PARAS, DO  benzonatate  (TESSALON ) 100 MG capsule Take 1 capsule (100 mg total) by mouth 3 (three) times daily as needed for cough. 05/31/24   Corlis Burnard DEL, NP    Family History Family History  Problem Relation Age of Onset   Diabetes Mother    Prostate cancer Neg Hx    Colon cancer Neg Hx     Social History Social History   Tobacco Use  Smoking status: Never   Smokeless tobacco: Never  Vaping Use   Vaping status: Never Used  Substance Use Topics   Alcohol use: Yes    Comment: daily beer   Drug use: No     Allergies   Spironolactone   Review of Systems Review of Systems  Constitutional:  Negative for chills and fever.  HENT:  Positive for congestion. Negative for ear pain and sore throat.   Respiratory:  Positive for cough. Negative for shortness of breath.   Cardiovascular:  Negative for chest pain and palpitations.  Gastrointestinal:  Negative for diarrhea and vomiting.     Physical Exam Triage Vital Signs ED Triage Vitals  Encounter Vitals Group     BP 05/31/24 1210 (!) 170/82     Girls  Systolic BP Percentile --      Girls Diastolic BP Percentile --      Boys Systolic BP Percentile --      Boys Diastolic BP Percentile --      Pulse Rate 05/31/24 1210 62     Resp 05/31/24 1210 17     Temp 05/31/24 1210 98.5 F (36.9 C)     Temp Source 05/31/24 1210 Oral     SpO2 05/31/24 1210 95 %     Weight 05/31/24 1208 230 lb (104.3 kg)     Height 05/31/24 1208 5' 10 (1.778 m)     Head Circumference --      Peak Flow --      Pain Score 05/31/24 1208 6     Pain Loc --      Pain Education --      Exclude from Growth Chart --    No data found.  Updated Vital Signs BP (!) 163/87 (BP Location: Left Arm)   Pulse 62   Temp 98.5 F (36.9 C) (Oral)   Resp 17   Ht 5' 10 (1.778 m)   Wt 230 lb (104.3 kg)   SpO2 95%   BMI 33.00 kg/m   Visual Acuity Right Eye Distance:   Left Eye Distance:   Bilateral Distance:    Right Eye Near:   Left Eye Near:    Bilateral Near:     Physical Exam Constitutional:      General: He is not in acute distress. HENT:     Right Ear: Tympanic membrane normal.     Left Ear: Tympanic membrane normal.     Nose: Rhinorrhea present.     Mouth/Throat:     Mouth: Mucous membranes are moist.     Pharynx: Oropharynx is clear.  Cardiovascular:     Rate and Rhythm: Normal rate and regular rhythm.     Heart sounds: Normal heart sounds.  Pulmonary:     Effort: Pulmonary effort is normal. No respiratory distress.     Breath sounds: Normal breath sounds.  Neurological:     Mental Status: He is alert.      UC Treatments / Results  Labs (all labs ordered are listed, but only abnormal results are displayed) Labs Reviewed  POC SOFIA SARS ANTIGEN FIA - Normal    EKG   Radiology No results found.  Procedures Procedures (including critical care time)  Medications Ordered in UC Medications - No data to display  Initial Impression / Assessment and Plan / UC Course  I have reviewed the triage vital signs and the nursing notes.  Pertinent  labs & imaging results that were available during my care of the patient were reviewed  by me and considered in my medical decision making (see chart for details).    Viral URI.  Elevated blood pressure reading with hypertension.  Afebrile.  Lungs are clear and O2 sat is 95% on room air.  Treating cough with Tessalon  Perles.  Tylenol as needed.  Caution patient to avoid OTC medications that can raise his blood pressure.  Discussed with patient that his blood pressure is elevated today and needs to be rechecked by his PCP.  Education provided on managing hypertension and also on viral URI.  Instructed patient to follow-up with his PCP.  ED precautions given.  He agrees to plan of care.  Final Clinical Impressions(s) / UC Diagnoses   Final diagnoses:  Viral URI  Elevated blood pressure reading in office with diagnosis of hypertension     Discharge Instructions      Your blood pressure is elevated today at 170/82; repeat 163/87.  Please have this rechecked by your primary care provider.      Your COVID test is negative.  Take the Tessalon  Perles as directed for cough.  Take Tylenol as needed for fever or discomfort.  Follow up with your primary care provider tomorrow.  Go to the emergency department if you have worsening symptoms.        ED Prescriptions     Medication Sig Dispense Auth. Provider   benzonatate  (TESSALON ) 100 MG capsule  (Status: Discontinued) Take 1 capsule (100 mg total) by mouth 3 (three) times daily as needed for cough. 21 capsule Corlis Sor H, NP   benzonatate  (TESSALON ) 100 MG capsule Take 1 capsule (100 mg total) by mouth 3 (three) times daily as needed for cough. 21 capsule Corlis Sor DEL, NP      PDMP not reviewed this encounter.   Corlis Sor DEL, NP 05/31/24 1300

## 2024-06-22 ENCOUNTER — Ambulatory Visit: Payer: Self-pay | Admitting: Internal Medicine

## 2024-06-22 ENCOUNTER — Encounter: Payer: Self-pay | Admitting: Emergency Medicine

## 2024-06-22 ENCOUNTER — Ambulatory Visit (INDEPENDENT_AMBULATORY_CARE_PROVIDER_SITE_OTHER)

## 2024-06-22 ENCOUNTER — Ambulatory Visit
Admission: EM | Admit: 2024-06-22 | Discharge: 2024-06-22 | Disposition: A | Discharge status: Home / Self Care | Attending: Internal Medicine | Admitting: Internal Medicine

## 2024-06-22 DIAGNOSIS — S91332A Puncture wound without foreign body, left foot, initial encounter: Secondary | ICD-10-CM | POA: Diagnosis not present

## 2024-06-22 NOTE — ED Provider Notes (Addendum)
 Darrell Jackson    CSN: [id number] Arrival date & time: 06/22/24  1029      History   Chief Complaint Chief Complaint  Patient presents with   Foot Injury    HPI Darrell Olivares Cerutti Sr. is a 68 y.o. male.   Darrell Landgrebe Miotke Sr. is a 68 y.o. male presenting for chief complaint of Foot Injury that happened 3 days ago. Patient was in the grocery store when a large glass divider suddenly fell and shattered on the floor. He was wearing crocs at the time without socks and states a piece of glass went through one of the holes in his shoes and punctured the top of the left foot over the proximal first metatarsal. He was able to pull a very small piece of glass out of the foot at the site of the puncture wound. He noticed a small amount of swelling to the foot around the site of the injury that night, swelling has since resolved. History of pre-diabetes, he's concerned the area may become infected. Denies redness, swelling, pus, pain, paresthesias distally, and difficulty walking after injury. He used alcohol to clean the site. Last tetanus injection was in 2024.      Past Medical History:  Diagnosis Date   Allergy    Gout    Hypertension    Thrombocytosis     Patient Active Problem List   Diagnosis Date Noted   Benign prostatic hyperplasia with incomplete bladder emptying 05/27/2018   Suspected sleep apnea 05/19/2017   Hyperlipidemia 09/07/2016   Erectile dysfunction 09/07/2016   Obesity (BMI 30.0-34.9) 06/24/2016   Osteoarthritis of left knee 06/24/2016   Chronic pain of left knee 06/24/2016   Exposure to hepatitis C 10/24/2015   Tinea pedis 09/03/2015   Resistant hypertension 07/10/2015   Pre-diabetes 07/10/2015    History reviewed. No pertinent surgical history.     Home Medications    Prior to Admission medications   Medication Sig Start Date End Date Taking? Authorizing Provider  amLODipine  (NORVASC ) 10 MG tablet Take 1 tablet (10 mg total) by mouth daily.  for blood pressure 01/22/22   Karamalegos, Marsa PARAS, DO  aspirin  EC 81 MG tablet Take 1 tablet (81 mg total) by mouth daily. 09/07/16   Karamalegos, Marsa PARAS, DO  benzonatate  (TESSALON ) 100 MG capsule Take 1 capsule (100 mg total) by mouth 3 (three) times daily as needed for cough. 05/31/24   Corlis Burnard DEL, NP  colchicine  0.6 MG tablet DAY 1 OF FLARE MAY TAKE 2 TABLETS FOR FIRST DOSE, THEN TAKE 1 TABLET DAILY AS NEEDED FOR FLARE UP TO 7-10 DAYS PER FLARE 06/16/22   Karamalegos, Marsa PARAS, DO  colchicine  0.6 MG tablet Take 1.2 mg (2 tablets ) when pain starts then in 1 hour take 0.6 mg (1 tablet), then take daily for up to 7 days 10/09/23   Teresa Shelba SAUNDERS, NP  EPINEPHrine  0.3 mg/0.3 mL IJ SOAJ injection Inject 0.3 mg into the muscle as needed for anaphylaxis. 05/31/24   Waymond Lorelle Cummins, MD  fluticasone  (FLONASE ) 50 MCG/ACT nasal spray Place 2 sprays into both nostrils daily. Use for 4-6 weeks then stop and use seasonally or as needed. 01/22/22   Karamalegos, Marsa PARAS, DO  lisinopril  (ZESTRIL ) 40 MG tablet Take 1 tablet (40 mg total) by mouth daily. 01/22/22   Karamalegos, Marsa PARAS, DO  metFORMIN  (GLUCOPHAGE ) 500 MG tablet Take 500 mg by mouth. 09/22/22   [provider]  metoprolol  succinate (TOPROL -XL)  25 MG 24 hr tablet Take 1 tablet (25 mg total) by mouth daily. 01/22/22   Karamalegos, Marsa PARAS, DO  ONE TOUCH ULTRA TEST test strip  09/17/15   [provider]  rosuvastatin  (CRESTOR ) 10 MG tablet Take 1 tablet (10 mg total) by mouth at bedtime. 01/22/22   Karamalegos, Marsa PARAS, DO  sildenafil  (REVATIO ) 20 MG tablet Take 1-5 pills about 30 min prior to sex. Start with 1 and increase as needed. 05/27/18   Karamalegos, Marsa PARAS, DO    Family History Family History  Problem Relation Age of Onset   Diabetes Mother    Prostate cancer Neg Hx    Colon cancer Neg Hx     Social History Social History   Tobacco Use   Smoking status: Never   Smokeless tobacco: Never  Vaping  Use   Vaping status: Never Used  Substance Use Topics   Alcohol use: Yes    Comment: daily beer   Drug use: No     Allergies   Lisinopril  and Spironolactone   Review of Systems Review of Systems Per HPI  Physical Exam Triage Vital Signs ED Triage Vitals  Encounter Vitals Group     BP 06/22/24 1039 (!) 162/88     Girls Systolic BP Percentile --      Girls Diastolic BP Percentile --      Boys Systolic BP Percentile --      Boys Diastolic BP Percentile --      Pulse Rate 06/22/24 1039 88     Resp 06/22/24 1039 18     Temp 06/22/24 1039 98 F (36.7 C)     Temp Source 06/22/24 1039 Oral     SpO2 06/22/24 1039 96 %     Weight --      Height --      Head Circumference --      Peak Flow --      Pain Score 06/22/24 1035 0     Pain Loc --      Pain Education --      Exclude from Growth Chart --    No data found.  Updated Vital Signs BP (!) 162/88 (BP Location: Left Arm)   Pulse 88   Temp 98 F (36.7 C) (Oral)   Resp 18   SpO2 96%   Visual Acuity Right Eye Distance:   Left Eye Distance:   Bilateral Distance:    Right Eye Near:   Left Eye Near:    Bilateral Near:     Physical Exam Vitals and nursing note reviewed.  Constitutional:      Appearance: He is not ill-appearing or toxic-appearing.  HENT:     Head: Normocephalic and atraumatic.     Right Ear: Hearing and external ear normal.     Left Ear: Hearing and external ear normal.     Nose: Nose normal.     Mouth/Throat:     Lips: Pink.  Eyes:     General: Lids are normal. Vision grossly intact. Gaze aligned appropriately.     Extraocular Movements: Extraocular movements intact.     Conjunctiva/sclera: Conjunctivae normal.  Cardiovascular:     Pulses:          Dorsalis pedis pulses are 2+ on the right side and 2+ on the left side.  Pulmonary:     Effort: Pulmonary effort is normal.  Musculoskeletal:     Cervical back: Neck supple.       Feet:  Feet:  Comments: Left foot: Less than 2 cap  refill distally, 5/5 strength, sensation intact distally. Non-tender to palpation around abrasion. See image below of puncture wound.  Skin:    General: Skin is warm and dry.     Capillary Refill: Capillary refill takes less than 2 seconds.     Findings: No rash.  Neurological:     General: No focal deficit present.     Mental Status: He is alert and oriented to person, place, and time. Mental status is at baseline.     Cranial Nerves: No dysarthria or facial asymmetry.  Psychiatric:        Mood and Affect: Mood normal.        Speech: Speech normal.        Behavior: Behavior normal.        Thought Content: Thought content normal.        Judgment: Judgment normal.    Pinpoint puncture wound of left foot   UC Treatments / Results  Labs (all labs ordered are listed, but only abnormal results are displayed) Labs Reviewed - No data to display  EKG   Radiology No results found.  Procedures Procedures (including critical care time)  Medications Ordered in UC Medications - No data to display  Initial Impression / Assessment and Plan / UC Course  I have reviewed the triage vital signs and the nursing notes.  Pertinent labs & imaging results that were available during my care of the patient were reviewed by me and considered in my medical decision making (see chart for details).   1. Puncture wound of left  foot X-ray is unremarkable for signs of retained radiopaque foreign body to the left foot. Neurovascularly intact distally to injury.  No current signs of infection, infection return precautions discussed. Tylenol PRN for pain.   Counseled patient on potential for adverse effects with medications prescribed/recommended today, strict ER and return-to-clinic precautions discussed, patient verbalized understanding.    Final Clinical Impressions(s) / UC Diagnoses   Final diagnoses:  Puncture wound of left foot, initial encounter     Discharge Instructions      There  are no current signs of residual glass to your foot on x-ray.  Tylenol 1,000mg  every 6 hours as needed for pain associated with injury.  Watch for signs of infection to the wound such as redness, swelling, pus, pain, warmth.   If you develop any new or worsening symptoms or if your symptoms do not start to improve, please return here or follow-up with your primary care provider. If your symptoms are severe, please go to the emergency room.     ED Prescriptions   None    PDMP not reviewed this encounter.   Enedelia Dorna HERO, FNP 06/22/24 1123    Enedelia Dorna HERO, [place] 06/22/24 1123

## 2024-06-22 NOTE — ED Triage Notes (Signed)
 Patient reports he had glass in his left foot that happen 3 days ago. Patient removed glass. Patient worried possible infection. Denies pain.

## 2024-06-22 NOTE — Discharge Instructions (Signed)
 There are no current signs of residual glass to your foot on x-ray.  Tylenol 1,000mg  every 6 hours as needed for pain associated with injury.  Watch for signs of infection to the wound such as redness, swelling, pus, pain, warmth.   If you develop any new or worsening symptoms or if your symptoms do not start to improve, please return here or follow-up with your primary care provider. If your symptoms are severe, please go to the emergency room.

## 2024-08-23 ENCOUNTER — Ambulatory Visit: Attending: Otolaryngology

## 2024-08-23 DIAGNOSIS — G4733 Obstructive sleep apnea (adult) (pediatric): Secondary | ICD-10-CM | POA: Diagnosis present
